# Patient Record
Sex: Female | Born: 1955 | Race: White | Hispanic: No | Marital: Married | Smoking: Never smoker
Health system: Southern US, Community
[De-identification: ages and names within clinical notes are randomized; demographics above are authoritative.]

## PROBLEM LIST (undated history)

## (undated) DIAGNOSIS — D509 Iron deficiency anemia, unspecified: Secondary | ICD-10-CM

## (undated) HISTORY — PX: TRIGGER FINGER RELEASE: SHX641

---

## 1999-05-18 ENCOUNTER — Ambulatory Visit (HOSPITAL_COMMUNITY): Admission: RE | Admit: 1999-05-18 | Discharge: 1999-05-18 | Payer: Self-pay | Admitting: Unknown Physician Specialty

## 1999-05-18 ENCOUNTER — Encounter: Payer: Self-pay | Admitting: Unknown Physician Specialty

## 2012-05-24 ENCOUNTER — Ambulatory Visit (INDEPENDENT_AMBULATORY_CARE_PROVIDER_SITE_OTHER): Payer: BC Managed Care – PPO | Admitting: Family Medicine

## 2012-05-24 ENCOUNTER — Encounter: Payer: Self-pay | Admitting: Family Medicine

## 2012-05-24 ENCOUNTER — Other Ambulatory Visit (HOSPITAL_COMMUNITY)
Admission: RE | Admit: 2012-05-24 | Discharge: 2012-05-24 | Disposition: A | Discharge status: Home / Self Care | Payer: BC Managed Care – PPO | Source: Ambulatory Visit | Attending: Family Medicine | Admitting: Family Medicine

## 2012-05-24 VITALS — BP 110/72 | HR 64 | Temp 97.9°F | Ht 61.0 in | Wt 111.0 lb

## 2012-05-24 DIAGNOSIS — Z0000 Encounter for general adult medical examination without abnormal findings: Secondary | ICD-10-CM | POA: Insufficient documentation

## 2012-05-24 DIAGNOSIS — D239 Other benign neoplasm of skin, unspecified: Secondary | ICD-10-CM

## 2012-05-24 DIAGNOSIS — D229 Melanocytic nevi, unspecified: Secondary | ICD-10-CM | POA: Insufficient documentation

## 2012-05-24 DIAGNOSIS — Z1151 Encounter for screening for human papillomavirus (HPV): Secondary | ICD-10-CM | POA: Insufficient documentation

## 2012-05-24 DIAGNOSIS — Z136 Encounter for screening for cardiovascular disorders: Secondary | ICD-10-CM

## 2012-05-24 DIAGNOSIS — Z01419 Encounter for gynecological examination (general) (routine) without abnormal findings: Secondary | ICD-10-CM | POA: Insufficient documentation

## 2012-05-24 LAB — CBC WITH DIFFERENTIAL/PLATELET
Basophils Absolute: 0 10*3/uL (ref 0.0–0.1)
Basophils Relative: 0.9 % (ref 0.0–3.0)
Eosinophils Absolute: 0.3 10*3/uL (ref 0.0–0.7)
Eosinophils Relative: 5.2 % — ABNORMAL HIGH (ref 0.0–5.0)
HCT: 35 % — ABNORMAL LOW (ref 36.0–46.0)
Hemoglobin: 11.7 g/dL — ABNORMAL LOW (ref 12.0–15.0)
Lymphocytes Relative: 34.5 % (ref 12.0–46.0)
Lymphs Abs: 1.8 10*3/uL (ref 0.7–4.0)
MCHC: 33.4 g/dL (ref 30.0–36.0)
MCV: 90.7 fl (ref 78.0–100.0)
Monocytes Absolute: 0.4 10*3/uL (ref 0.1–1.0)
Monocytes Relative: 8.5 % (ref 3.0–12.0)
Neutro Abs: 2.6 10*3/uL (ref 1.4–7.7)
Neutrophils Relative %: 50.9 % (ref 43.0–77.0)
Platelets: 264 10*3/uL (ref 150.0–400.0)
RBC: 3.86 Mil/uL — ABNORMAL LOW (ref 3.87–5.11)
RDW: 12.7 % (ref 11.5–14.6)
WBC: 5.1 10*3/uL (ref 4.5–10.5)

## 2012-05-24 LAB — COMPREHENSIVE METABOLIC PANEL
ALT: 16 U/L (ref 0–35)
AST: 15 U/L (ref 0–37)
Albumin: 4.2 g/dL (ref 3.5–5.2)
Alkaline Phosphatase: 71 U/L (ref 39–117)
BUN: 13 mg/dL (ref 6–23)
CO2: 29 mEq/L (ref 19–32)
Calcium: 9.4 mg/dL (ref 8.4–10.5)
Chloride: 105 mEq/L (ref 96–112)
Creatinine, Ser: 0.9 mg/dL (ref 0.4–1.2)
GFR: 69.67 mL/min (ref >60.00)
Glucose, Bld: 92 mg/dL (ref 70–99)
Potassium: 4.3 mEq/L (ref 3.5–5.1)
Sodium: 140 mEq/L (ref 135–145)
Total Bilirubin: 0.5 mg/dL (ref 0.3–1.2)
Total Protein: 6.9 g/dL (ref 6.0–8.3)

## 2012-05-24 LAB — LIPID PANEL
Cholesterol: 207 mg/dL — ABNORMAL HIGH (ref 0–200)
HDL: 70.1 mg/dL (ref >39.00)
Total CHOL/HDL Ratio: 3
Triglycerides: 154 mg/dL — ABNORMAL HIGH (ref 0.0–149.0)
VLDL: 30.8 mg/dL (ref 0.0–40.0)

## 2012-05-24 LAB — LDL CHOLESTEROL, DIRECT: Direct LDL: 106.8 mg/dL

## 2012-05-24 NOTE — Patient Instructions (Signed)
It was so nice to meet you. Please stop by to see Shirlee Limerick on your way out to set up your dermatology referral. We will call you with your lab results from today.

## 2012-05-24 NOTE — Progress Notes (Signed)
Subjective:    Patient ID: Denise Roy, female    DOB: 30-Mar-1956, 56 y.o.   MRN: [id number]  HPI  56 yo G2P2 here to establish care.  Post menopausal, not having any more menopausal symptoms. Last pap smear was in 2009.   No h/o abnormal pap smears. Mammogram was normal in May. Colonoscopy neg last year.  Patient Active Problem List  Diagnosis  . Routine general medical examination at a health care facility  . Multiple nevi   No past medical history on file. No past surgical history on file. History  Substance Use Topics  . Smoking status: Never Smoker   . Smokeless tobacco: Not on file  . Alcohol Use: Not on file   Family History  Problem Relation Age of Onset  . Hypertension Mother   . Cancer Father    Allergies  Allergen Reactions  . Erythorbic Acid Nausea Only   No current outpatient prescriptions on file prior to visit.   The PMH, PSH, Social History, Family History, Medications, and allergies have been reviewed in [facility], and have been updated if relevant.   Review of Systems See HPI Patient reports no  vision/ hearing changes,anorexia, weight change, fever ,adenopathy, persistant / recurrent hoarseness, swallowing issues, chest pain, edema,persistant / recurrent cough, hemoptysis, dyspnea(rest, exertional, paroxysmal nocturnal), gastrointestinal  bleeding (melena, rectal bleeding), abdominal pain, excessive heart burn, GU symptoms(dysuria, hematuria, pyuria, voiding/incontinence  Issues) syncope, focal weakness, severe memory loss, concerning skin lesions, depression, anxiety, abnormal bruising/bleeding, major joint swelling, breast masses or abnormal vaginal bleeding.       Objective:   Physical Exam BP 110/72  Pulse 64  Temp 97.9 F (36.6 C) (Oral)  Ht 5\' 1"  (1.549 m)  Wt 111 lb (50.349 kg)  BMI 20.97 kg/m2  General:  Well-developed,well-nourished,in no acute distress; alert,appropriate and cooperative throughout examination Head:  normocephalic and  atraumatic.   Eyes:  vision grossly intact, pupils equal, pupils round, and pupils reactive to light.   Ears:  R ear normal and L ear normal.   Nose:  no external deformity.   Mouth:  good dentition.   Neck:  No deformities, masses, or tenderness noted. Breasts:  No mass, nodules, thickening, tenderness, bulging, retraction, inflamation, nipple discharge or skin changes noted.   Lungs:  Normal respiratory effort, chest expands symmetrically. Lungs are clear to auscultation, no crackles or wheezes. Heart:  Normal rate and regular rhythm. S1 and S2 normal without gallop, murmur, click, rub or other extra sounds. Abdomen:  Bowel sounds positive,abdomen soft and non-tender without masses, organomegaly or hernias noted. Rectal:  no external abnormalities.   Genitalia:  Pelvic Exam:        External: normal female genitalia without lesions or masses        Vagina: normal without lesions or masses        Cervix: normal without lesions or masses        Adnexa: normal bimanual exam without masses or fullness        Uterus: normal by palpation        Pap smear: performed Msk:  No deformity or scoliosis noted of thoracic or lumbar spine.   Extremities:  No clubbing, cyanosis, edema, or deformity noted with normal full range of motion of all joints.   Neurologic:  alert & oriented X3 and gait normal.   Skin:  multiple AK, nevi Cervical Nodes:  No lymphadenopathy noted Axillary Nodes:  No palpable lymphadenopathy Psych:  Cognition and judgment appear intact. Alert and  cooperative with normal attention span and concentration. No apparent delusions, illusions, hallucinations        Assessment & Plan:   1. Routine general medical examination at a health care facility  Reviewed preventive care protocols, scheduled due services, and updated immunizations Discussed nutrition, exercise, diet, and healthy lifestyle.  CBC with Differential, Comprehensive metabolic panel, Cytology - PAP  2. Multiple nevi   Ambulatory referral to Dermatology  3. Screening for ischemic heart disease  Lipid Panel

## 2012-05-31 ENCOUNTER — Encounter: Payer: Self-pay | Admitting: Family Medicine

## 2012-05-31 ENCOUNTER — Encounter: Payer: Self-pay | Admitting: *Deleted

## 2012-05-31 LAB — HM PAP SMEAR: HM Pap smear: NORMAL

## 2012-10-03 ENCOUNTER — Encounter: Payer: Self-pay | Admitting: Family Medicine

## 2012-10-03 ENCOUNTER — Ambulatory Visit (INDEPENDENT_AMBULATORY_CARE_PROVIDER_SITE_OTHER): Payer: BC Managed Care – PPO | Admitting: Family Medicine

## 2012-10-03 VITALS — BP 106/70 | HR 66 | Temp 97.5°F | Ht 61.0 in | Wt 115.0 lb

## 2012-10-03 DIAGNOSIS — M542 Cervicalgia: Secondary | ICD-10-CM | POA: Insufficient documentation

## 2012-10-03 MED ORDER — CYCLOBENZAPRINE HCL 10 MG PO TABS: 10.0000 mg | ORAL_TABLET | Freq: Every evening | ORAL | Status: DC | PRN

## 2012-10-03 NOTE — Assessment & Plan Note (Signed)
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

## 2012-10-03 NOTE — Patient Instructions (Addendum)
I think you are suffering from tight neck muscles = and you need better positioning at night  Get a cervical support pillow made of memory foam  You can use it alone or on top of your regular pillow  Use heat on neck for 10 minutes at a time to relax muscles Try flexeril (muscle relaxer) at night  If not improved in 1-2 weeks, call for an appt with Dr Patsy Lager - our sports medicine doctor here

## 2012-10-03 NOTE — Progress Notes (Signed)
Subjective:    Patient ID: Denise Roy, female    DOB: [birth year], 56 y.o.   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 Routine general medical examination at a health care facility  . Multiple nevi   No past medical history on file. No past surgical history on file. History  Substance Use Topics  . Smoking status: Never Smoker   . Smokeless tobacco: Not on file  . Alcohol Use: No   Family History  Problem Relation Age of Onset  . Hypertension Mother   . Cancer Father    Allergies  Allergen Reactions  . Erythorbic Acid Nausea Only   Current Outpatient Prescriptions on File Prior to Visit  Medication Sig Dispense Refill  . BIOTIN PO Take 500 mg's by mouth daily      . Multiple Vitamin (MULTIVITAMIN) tablet Take 1 tablet by mouth daily.            Review of Systems Review of Systems  Constitutional: Negative for fever, appetite change, fatigue and unexpected weight change.  Eyes: Negative for pain and visual disturbance.  Respiratory: Negative for cough and shortness of breath.   Cardiovascular: Negative for cp or palpitations    Gastrointestinal: Negative for nausea, diarrhea and constipation.  Genitourinary: Negative for urgency and frequency.  Skin: Negative for pallor or rash   Neurological: Negative for weakness, light-headedness, numbness and headaches.    Hematological: Negative for adenopathy. Does not bruise/bleed easily.  Psychiatric/Behavioral: Negative for dysphoric mood. The patient is not nervous/anxious.         Objective:   Physical Exam  Constitutional: She appears well-developed and well-nourished. No distress.  HENT:  Head: Normocephalic and atraumatic.  Mouth/Throat: Oropharynx is clear and moist.  Eyes: Conjunctivae normal and EOM are normal. Pupils are equal, round, and reactive to light. Right eye exhibits no discharge. Left eye exhibits no discharge.  Neck: Normal range of motion. Neck supple. No JVD present. No tracheal deviation present. No thyromegaly present.  Cardiovascular: Normal rate and regular rhythm.   Pulmonary/Chest: Effort normal and breath sounds normal.  Musculoskeletal:       Cervical back: She exhibits tenderness, pain and spasm. She exhibits normal range of motion, no bony tenderness, no swelling, no edema and no deformity.       Nl rom of her neck - with tenderness and spasm over peri cervical musculature  Worse on the R  Nl flex/ ext More pain to rotate R  No scapular tenderness No trap tenderness   Lymphadenopathy:    She has no cervical adenopathy.  Neurological: She is alert. She has normal reflexes. She displays no atrophy. No cranial nerve deficit or sensory deficit.  She exhibits normal muscle tone. Coordination normal.  Skin: Skin is warm and dry. No rash noted. No erythema.  Psychiatric: She has a normal mood and affect.          Assessment & Plan:

## 2012-10-08 ENCOUNTER — Ambulatory Visit: Payer: BC Managed Care – PPO | Admitting: Family Medicine

## 2012-11-19 ENCOUNTER — Encounter: Payer: Self-pay | Admitting: Family Medicine

## 2012-11-19 ENCOUNTER — Ambulatory Visit (INDEPENDENT_AMBULATORY_CARE_PROVIDER_SITE_OTHER): Payer: BC Managed Care – PPO | Admitting: Family Medicine

## 2012-11-19 VITALS — BP 122/66 | HR 83 | Temp 99.6°F | Wt 112.0 lb

## 2012-11-19 DIAGNOSIS — B9789 Other viral agents as the cause of diseases classified elsewhere: Secondary | ICD-10-CM

## 2012-11-19 DIAGNOSIS — B349 Viral infection, unspecified: Secondary | ICD-10-CM | POA: Insufficient documentation

## 2012-11-19 MED ORDER — BENZONATATE 200 MG PO CAPS: 200.0000 mg | ORAL_CAPSULE | Freq: Three times a day (TID) | ORAL | Status: AC | PRN

## 2012-11-19 NOTE — Assessment & Plan Note (Signed)
Nontoxic, likely viral.  Supportive tx.  Would use tessalon for cough.  Possible flu but well appearing and >48 hours so testing would likely not change mgmt.  F/u prn.

## 2012-11-19 NOTE — Progress Notes (Signed)
Sx stared about 3 days ago.  Cough and some nausea.  Fever over the last 2-3 days.  Cough and aches.  Burning sensation in throat.  Ears feels stuffy intermittently.  No rhinorrhea.  Husband has been sick.  Fever max 101.7. Taking tylenol w/o much relief.  The cough is most bothersome to the patient.     She hasn't had a flu shot yet, discussed.    Meds, vitals, and allergies reviewed.   ROS: See HPI.  Otherwise, noncontributory.  GEN: nad, alert and oriented HEENT: mucous membranes moist, tm w/o erythema, nasal exam w/o erythema, clear discharge noted,  OP with cobblestoning, sinuses not ttp NECK: supple w/o LA CV: rrr.   PULM: ctab, no inc wob EXT: no edema SKIN: no acute rash

## 2012-11-19 NOTE — Patient Instructions (Signed)
Get a flu shot when you are well.  Use tessalon for the cough.  Drink plenty of fluids, take tylenol as needed, and gargle with warm salt water for your throat.  This should gradually improve.  Take care.  Let us know if you have other concerns.

## 2012-11-27 ENCOUNTER — Other Ambulatory Visit (INDEPENDENT_AMBULATORY_CARE_PROVIDER_SITE_OTHER): Payer: BC Managed Care – PPO

## 2012-11-27 DIAGNOSIS — R6889 Other general symptoms and signs: Secondary | ICD-10-CM

## 2012-11-27 DIAGNOSIS — R899 Unspecified abnormal finding in specimens from other organs, systems and tissues: Secondary | ICD-10-CM

## 2012-11-28 LAB — CBC WITH DIFFERENTIAL/PLATELET
Eosinophils Relative: 5.1 % — ABNORMAL HIGH (ref 0.0–5.0)
HCT: 34.6 % — ABNORMAL LOW (ref 36.0–46.0)
Hemoglobin: 11.5 g/dL — ABNORMAL LOW (ref 12.0–15.0)
Lymphs Abs: 2.2 10*3/uL (ref 0.7–4.0)
MCV: 89.3 fl (ref 78.0–100.0)
Monocytes Absolute: 0.5 10*3/uL (ref 0.1–1.0)
Neutro Abs: 3.5 10*3/uL (ref 1.4–7.7)
Platelets: 349 10*3/uL (ref 150.0–400.0)
WBC: 6.6 10*3/uL (ref 4.5–10.5)

## 2013-08-08 ENCOUNTER — Ambulatory Visit (INDEPENDENT_AMBULATORY_CARE_PROVIDER_SITE_OTHER): Payer: BC Managed Care – PPO | Admitting: Internal Medicine

## 2013-08-08 ENCOUNTER — Encounter: Payer: Self-pay | Admitting: Internal Medicine

## 2013-08-08 VITALS — BP 118/72 | HR 61 | Temp 98.9°F | Wt 114.0 lb

## 2013-08-08 DIAGNOSIS — Z130 Encounter for screening for diseases of the blood and blood-forming organs and certain disorders involving the immune mechanism: Secondary | ICD-10-CM

## 2013-08-08 DIAGNOSIS — Z0000 Encounter for general adult medical examination without abnormal findings: Secondary | ICD-10-CM

## 2013-08-08 DIAGNOSIS — Z13220 Encounter for screening for lipoid disorders: Secondary | ICD-10-CM

## 2013-08-08 LAB — COMPREHENSIVE METABOLIC PANEL
ALT: 18 U/L (ref 0–35)
AST: 19 U/L (ref 0–37)
Alkaline Phosphatase: 62 U/L (ref 39–117)
CO2: 31 mEq/L (ref 19–32)
Creatinine, Ser: 0.9 mg/dL (ref 0.4–1.2)
Sodium: 138 mEq/L (ref 135–145)
Total Bilirubin: 0.8 mg/dL (ref 0.3–1.2)
Total Protein: 6.6 g/dL (ref 6.0–8.3)

## 2013-08-08 LAB — LDL CHOLESTEROL, DIRECT: Direct LDL: 127 mg/dL

## 2013-08-08 LAB — LIPID PANEL
HDL: 63.5 mg/dL (ref >39.00)
Total CHOL/HDL Ratio: 3
VLDL: 14.8 mg/dL (ref 0.0–40.0)

## 2013-08-08 LAB — CBC
HCT: 36.6 % (ref 36.0–46.0)
MCHC: 34 g/dL (ref 30.0–36.0)
MCV: 90.1 fl (ref 78.0–100.0)
RDW: 12.7 % (ref 11.5–14.6)
WBC: 4.7 10*3/uL (ref 4.5–10.5)

## 2013-08-08 NOTE — Patient Instructions (Signed)

## 2013-08-08 NOTE — Progress Notes (Signed)
HPI  Pt presents to the clinic today for her annual exam. She has no concerns today.  Flu: never Tetanus: not up to date LMP: Post menopausal Pap Smear: 2013 Mammogram: having it done today- yearly Colonoscopy: 2012- polyps Eye Doctor: yearly Dentist: biannually  History reviewed. No pertinent past medical history.  Current Outpatient Prescriptions  Medication Sig Dispense Refill  . BIOTIN PO Take 500 mg's by mouth daily      . Multiple Vitamin (MULTIVITAMIN) tablet Take 1 tablet by mouth daily.       No current facility-administered medications for this visit.    Allergies  Allergen Reactions  . Erythromycin     Stomach upset    Family History  Problem Relation Age of Onset  . Hypertension Mother   . Cancer Father     History   Social History  . Marital Status: Married    Spouse Name: N/A    Number of Children: N/A  . Years of Education: N/A   Occupational History  . Not on file.   Social History Main Topics  . Smoking status: Never Smoker   . Smokeless tobacco: Not on file  . Alcohol Use: No  . Drug Use: No  . Sexual Activity: Not on file   Other Topics Concern  . Not on file   Social History Narrative  . No narrative on file    ROS:  Constitutional: Denies fever, malaise, fatigue, headache or abrupt weight changes.  HEENT: Denies eye pain, eye redness, ear pain, ringing in the ears, wax buildup, runny nose, nasal congestion, bloody nose, or sore throat. Respiratory: Denies difficulty breathing, shortness of breath, cough or sputum production.   Cardiovascular: Denies chest pain, chest tightness, palpitations or swelling in the hands or feet.  Gastrointestinal: Denies abdominal pain, bloating, constipation, diarrhea or blood in the stool.  GU: Denies frequency, urgency, pain with urination, blood in urine, odor or discharge. Musculoskeletal: Denies decrease in range of motion, difficulty with gait, muscle pain or joint pain and swelling.  Skin:  Denies redness, rashes, lesions or ulcercations.  Neurological: Denies dizziness, difficulty with memory, difficulty with speech or problems with balance and coordination.   No other specific complaints in a complete review of systems (except as listed in HPI above).  PE:  BP 118/72  Pulse 61  Temp(Src) 98.9 F (37.2 C) (Oral)  Wt 114 lb (51.71 kg)  BMI 21.55 kg/m2  SpO2 98% Wt Readings from Last 3 Encounters:  08/08/13 114 lb (51.71 kg)  11/19/12 112 lb (50.803 kg)  10/03/12 115 lb (52.164 kg)    General: Appears her stated age, well developed, well nourished in NAD. HEENT: Head: normal shape and size; Eyes: sclera white, no icterus, conjunctiva pink, PERRLA and EOMs intact; Ears: Tm's gray and intact, normal light reflex; Nose: mucosa pink and moist, septum midline; Throat/Mouth: Teeth present, mucosa pink and moist, no lesions or ulcerations noted.  Neck: Normal range of motion. Neck supple, trachea midline. No massses, lumps or thyromegaly present.  Cardiovascular: Normal rate and rhythm. S1,S2 noted.  No murmur, rubs or gallops noted. No JVD or BLE edema. No carotid bruits noted. Pulmonary/Chest: Normal effort and positive vesicular breath sounds. No respiratory distress. No wheezes, rales or ronchi noted.  Abdomen: Soft and nontender. Normal bowel sounds, no bruits noted. No distention or masses noted. Liver, spleen and kidneys non palpable. Musculoskeletal: Normal range of motion. No signs of joint swelling. No difficulty with gait.  Neurological: Alert and oriented. Cranial nerves  II-XII intact. Coordination normal. +DTRs bilaterally. Psychiatric: Mood and affect normal. Behavior is normal. Judgment and thought content normal.    BMET    Component Value Date/Time   NA 140 05/24/2012 1421   K 4.3 05/24/2012 1421   CL 105 05/24/2012 1421   CO2 29 05/24/2012 1421   GLUCOSE 92 05/24/2012 1421   BUN 13 05/24/2012 1421   CREATININE 0.9 05/24/2012 1421   CALCIUM 9.4 05/24/2012 1421     Lipid Panel     Component Value Date/Time   CHOL 207* 05/24/2012 1421   TRIG 154.0* 05/24/2012 1421   HDL 70.10 05/24/2012 1421   CHOLHDL 3 05/24/2012 1421   VLDL 30.8 05/24/2012 1421    CBC    Component Value Date/Time   WBC 6.6 11/27/2012 1607   RBC 3.87 11/27/2012 1607   HGB 11.5* 11/27/2012 1607   HCT 34.6* 11/27/2012 1607   PLT 349.0 11/27/2012 1607   MCV 89.3 11/27/2012 1607   MCHC 33.4 11/27/2012 1607   RDW 12.6 11/27/2012 1607   LYMPHSABS 2.2 11/27/2012 1607   MONOABS 0.5 11/27/2012 1607   EOSABS 0.3 11/27/2012 1607   BASOSABS 0.0 11/27/2012 1607    Hgb A1C No results found for this basename: HGBA1C     Assessment and Plan:  Prevent Health Maintenance:  Pt declines flu and Tdap today Will repeat pap in 2 years per ACS/ACOG guidelines Will obtain screening labs today  RTC in 1 year or sooner if needed

## 2013-08-09 ENCOUNTER — Telehealth: Payer: Self-pay

## 2013-08-09 NOTE — Telephone Encounter (Signed)
Mrs Navia returning Chrisandra Carota CMA's call. Transferred call to [employer] office [phone]. Geroge Baseman will transfer pt to [place].

## 2013-08-21 ENCOUNTER — Encounter: Payer: Self-pay | Admitting: Family Medicine

## 2013-08-22 ENCOUNTER — Telehealth: Payer: Self-pay

## 2013-08-22 NOTE — Telephone Encounter (Signed)
Will route to [facility] since she saw her for CPX and was following those labs.

## 2013-08-22 NOTE — Telephone Encounter (Signed)
Pt wants to know when she should have cholesterol labs rechecked; and should pt continue taking prenatal vitamin with iron.pt request copy of labs mailed to her home address (done).

## 2013-08-22 NOTE — Telephone Encounter (Signed)
Ok ot continue prenatal vitamin and will recheck lipid in 1 year. It was only mildly elevated

## 2013-08-23 NOTE — Telephone Encounter (Signed)
Pt notified that she can continue to take the prenatal vitamin and of recheck of lipids in 1 yr....ds,cma

## 2014-09-12 ENCOUNTER — Other Ambulatory Visit: Payer: Self-pay | Admitting: Family Medicine

## 2014-09-12 DIAGNOSIS — Z01419 Encounter for gynecological examination (general) (routine) without abnormal findings: Secondary | ICD-10-CM | POA: Insufficient documentation

## 2014-09-12 LAB — HM MAMMOGRAPHY: HM MAMMO: NEGATIVE

## 2014-09-15 ENCOUNTER — Other Ambulatory Visit (INDEPENDENT_AMBULATORY_CARE_PROVIDER_SITE_OTHER): Payer: BC Managed Care – PPO

## 2014-09-15 DIAGNOSIS — Z0000 Encounter for general adult medical examination without abnormal findings: Secondary | ICD-10-CM

## 2014-09-15 DIAGNOSIS — Z01419 Encounter for gynecological examination (general) (routine) without abnormal findings: Secondary | ICD-10-CM

## 2014-09-15 LAB — CBC WITH DIFFERENTIAL/PLATELET
BASOS PCT: 0.7 % (ref 0.0–3.0)
Basophils Absolute: 0 10*3/uL (ref 0.0–0.1)
EOS PCT: 7.6 % — AB (ref 0.0–5.0)
Eosinophils Absolute: 0.4 10*3/uL (ref 0.0–0.7)
HEMATOCRIT: 36.6 % (ref 36.0–46.0)
Hemoglobin: 12 g/dL (ref 12.0–15.0)
Lymphocytes Relative: 35.8 % (ref 12.0–46.0)
Lymphs Abs: 1.9 10*3/uL (ref 0.7–4.0)
MCHC: 32.9 g/dL (ref 30.0–36.0)
MCV: 91.2 fl (ref 78.0–100.0)
MONO ABS: 0.4 10*3/uL (ref 0.1–1.0)
Monocytes Relative: 8 % (ref 3.0–12.0)
Neutro Abs: 2.5 10*3/uL (ref 1.4–7.7)
Neutrophils Relative %: 47.9 % (ref 43.0–77.0)
PLATELETS: 236 10*3/uL (ref 150.0–400.0)
RBC: 4.01 Mil/uL (ref 3.87–5.11)
RDW: 12.7 % (ref 11.5–15.5)
WBC: 5.3 10*3/uL (ref 4.0–10.5)

## 2014-09-15 LAB — COMPREHENSIVE METABOLIC PANEL
ALBUMIN: 3.7 g/dL (ref 3.5–5.2)
ALK PHOS: 73 U/L (ref 39–117)
ALT: 19 U/L (ref 0–35)
AST: 20 U/L (ref 0–37)
BILIRUBIN TOTAL: 0.7 mg/dL (ref 0.2–1.2)
BUN: 14 mg/dL (ref 6–23)
CO2: 24 mEq/L (ref 19–32)
Calcium: 9.6 mg/dL (ref 8.4–10.5)
Chloride: 105 mEq/L (ref 96–112)
Creatinine, Ser: 0.9 mg/dL (ref 0.4–1.2)
GFR: 64.88 mL/min (ref >60.00)
GLUCOSE: 95 mg/dL (ref 70–99)
Potassium: 4.5 mEq/L (ref 3.5–5.1)
Sodium: 142 mEq/L (ref 135–145)
Total Protein: 6.9 g/dL (ref 6.0–8.3)

## 2014-09-15 LAB — LIPID PANEL
CHOLESTEROL: 206 mg/dL — AB (ref 0–200)
HDL: 56 mg/dL (ref >39.00)
LDL CALC: 132 mg/dL — AB (ref 0–99)
NonHDL: 150
Total CHOL/HDL Ratio: 4
Triglycerides: 90 mg/dL (ref 0.0–149.0)
VLDL: 18 mg/dL (ref 0.0–40.0)

## 2014-09-15 LAB — TSH: TSH: 2.23 u[IU]/mL (ref 0.35–4.50)

## 2014-09-17 ENCOUNTER — Encounter: Payer: Self-pay | Admitting: *Deleted

## 2014-09-18 ENCOUNTER — Encounter: Payer: Self-pay | Admitting: Family Medicine

## 2014-09-18 ENCOUNTER — Other Ambulatory Visit (HOSPITAL_COMMUNITY)
Admission: RE | Admit: 2014-09-18 | Discharge: 2014-09-18 | Disposition: A | Discharge status: Home / Self Care | Payer: BC Managed Care – PPO | Source: Ambulatory Visit | Attending: Family Medicine | Admitting: Family Medicine

## 2014-09-18 ENCOUNTER — Ambulatory Visit (INDEPENDENT_AMBULATORY_CARE_PROVIDER_SITE_OTHER): Payer: BC Managed Care – PPO | Admitting: Family Medicine

## 2014-09-18 VITALS — BP 110/60 | HR 55 | Temp 98.3°F | Ht 60.75 in | Wt 113.5 lb

## 2014-09-18 DIAGNOSIS — Z01419 Encounter for gynecological examination (general) (routine) without abnormal findings: Secondary | ICD-10-CM | POA: Diagnosis present

## 2014-09-18 DIAGNOSIS — Z1151 Encounter for screening for human papillomavirus (HPV): Secondary | ICD-10-CM | POA: Insufficient documentation

## 2014-09-18 DIAGNOSIS — Z0000 Encounter for general adult medical examination without abnormal findings: Secondary | ICD-10-CM

## 2014-09-18 NOTE — Progress Notes (Signed)
Pre visit review using our clinic review tool, if applicable. No additional management support is needed unless otherwise documented below in the visit note. 

## 2014-09-18 NOTE — Progress Notes (Signed)
Subjective:    Patient ID: Denise Roy, female    DOB: 12/10/1955, 58 y.o.   MRN: [id number]  HPI  58 yo G2P2 here to establish care.  Post menopausal, not having any more menopausal symptoms. Last pap smear was 05/24/12 (done by me).   No h/o abnormal pap smears. No post menopausal bleeding. Mammogram was normal 09/12/14.  Colonoscopy neg 06/02/11- Dr. Vira Agar.  Lab Results  Component Value Date   CHOL 206* 09/15/2014   HDL 56.00 09/15/2014   LDLCALC 132* 09/15/2014   LDLDIRECT 127.0 08/08/2013   TRIG 90.0 09/15/2014   CHOLHDL 4 09/15/2014   Lab Results  Component Value Date   WBC 5.3 09/15/2014   HGB 12.0 09/15/2014   HCT 36.6 09/15/2014   MCV 91.2 09/15/2014   PLT 236.0 09/15/2014   Lab Results  Component Value Date   CREATININE 0.9 09/15/2014   Lab Results  Component Value Date   TSH 2.23 09/15/2014   Lab Results  Component Value Date   NA 142 09/15/2014   K 4.5 09/15/2014   CL 105 09/15/2014   CO2 24 09/15/2014     Patient Active Problem List   Diagnosis Date Noted  . Well woman exam 09/12/2014   No past medical history on file. No past surgical history on file. History  Substance Use Topics  . Smoking status: Never Smoker   . Smokeless tobacco: Not on file  . Alcohol Use: No   Family History  Problem Relation Age of Onset  . Hypertension Mother   . Cancer Father    Allergies  Allergen Reactions  . Erythromycin     Stomach upset   Current Outpatient Prescriptions on File Prior to Visit  Medication Sig Dispense Refill  . BIOTIN PO Take 500 mg's by mouth daily    . Multiple Vitamin (MULTIVITAMIN) tablet Take 1 tablet by mouth daily.     No current facility-administered medications on file prior to visit.   The PMH, PSH, Social History, Family History, Medications, and allergies have been reviewed in [facility], and have been updated if relevant.   Review of Systems See HPI Patient reports no  vision/ hearing changes,anorexia, weight  change, fever ,adenopathy, persistant / recurrent hoarseness, swallowing issues, chest pain, edema,persistant / recurrent cough, hemoptysis, dyspnea(rest, exertional, paroxysmal nocturnal), gastrointestinal  bleeding (melena, rectal bleeding), abdominal pain, excessive heart burn, GU symptoms(dysuria, hematuria, pyuria, voiding/incontinence  Issues) syncope, focal weakness, severe memory loss, concerning skin lesions, depression, anxiety, abnormal bruising/bleeding, major joint swelling, breast masses or abnormal vaginal bleeding.       Objective:   Physical Exam BP 110/60 mmHg  Pulse 55  Temp(Src) 98.3 F (36.8 C) (Oral)  Ht 5' 0.75" (1.543 m)  Wt 113 lb 8 oz (51.483 kg)  BMI 21.62 kg/m2  SpO2 97%  General:  Well-developed,well-nourished,in no acute distress; alert,appropriate and cooperative throughout examination Head:  normocephalic and atraumatic.   Eyes:  vision grossly intact, pupils equal, pupils round, and pupils reactive to light.   Ears:  R ear normal and L ear normal.   Nose:  no external deformity.   Mouth:  good dentition.   Neck:  No deformities, masses, or tenderness noted. Breasts:  No mass, nodules, thickening, tenderness, bulging, retraction, inflamation, nipple discharge or skin changes noted.   Lungs:  Normal respiratory effort, chest expands symmetrically. Lungs are clear to auscultation, no crackles or wheezes. Heart:  Normal rate and regular rhythm. S1 and S2 normal without gallop, murmur, click, rub or  other extra sounds. Abdomen:  Bowel sounds positive,abdomen soft and non-tender without masses, organomegaly or hernias noted. Rectal:  no external abnormalities.   Genitalia:  Pelvic Exam:        External: normal female genitalia without lesions or masses        Vagina: normal without lesions or masses        Cervix: normal without lesions or masses        Adnexa: normal bimanual exam without masses or fullness        Uterus: normal by palpation        Pap  smear: performed Msk:  No deformity or scoliosis noted of thoracic or lumbar spine.   Extremities:  No clubbing, cyanosis, edema, or deformity noted with normal full range of motion of all joints.   Neurologic:  alert & oriented X3 and gait normal.   Skin:  multiple AK, nevi Cervical Nodes:  No lymphadenopathy noted Axillary Nodes:  No palpable lymphadenopathy Psych:  Cognition and judgment appear intact. Alert and cooperative with normal attention span and concentration. No apparent delusions, illusions, hallucinations        Assessment & Plan:

## 2014-09-18 NOTE — Patient Instructions (Addendum)
Great to see you. Your blood work looked great. Happy Holidays. Check with your insurance to see if they will cover the shingles shot.

## 2014-09-18 NOTE — Assessment & Plan Note (Signed)
Reviewed preventive care protocols, scheduled due services, and updated immunizations Discussed nutrition, exercise, diet, and healthy lifestyle.  Declines influenza vaccine.  Discussed USPSTF recommendations of cervical cancer screening.  She is aware that interval of 3 years is recommended but pt would prefer to have pap smear done today.

## 2014-09-18 NOTE — Addendum Note (Signed)
Addended by: Modena Nunnery on: 09/18/2014 12:41 PM   Modules accepted: Orders

## 2014-09-19 LAB — CYTOLOGY - PAP

## 2014-09-22 ENCOUNTER — Encounter: Payer: Self-pay | Admitting: *Deleted

## 2014-10-15 ENCOUNTER — Other Ambulatory Visit: Payer: Self-pay

## 2015-09-21 ENCOUNTER — Other Ambulatory Visit (INDEPENDENT_AMBULATORY_CARE_PROVIDER_SITE_OTHER): Payer: BLUE CROSS/BLUE SHIELD

## 2015-09-21 ENCOUNTER — Other Ambulatory Visit: Payer: Self-pay | Admitting: Family Medicine

## 2015-09-21 DIAGNOSIS — Z0000 Encounter for general adult medical examination without abnormal findings: Secondary | ICD-10-CM | POA: Diagnosis not present

## 2015-09-21 DIAGNOSIS — Z01419 Encounter for gynecological examination (general) (routine) without abnormal findings: Secondary | ICD-10-CM

## 2015-09-21 LAB — CBC WITH DIFFERENTIAL/PLATELET
Basophils Absolute: 0 10*3/uL (ref 0.0–0.1)
Basophils Relative: 0.9 % (ref 0.0–3.0)
EOS PCT: 4.2 % (ref 0.0–5.0)
Eosinophils Absolute: 0.2 10*3/uL (ref 0.0–0.7)
HEMATOCRIT: 36.3 % (ref 36.0–46.0)
Hemoglobin: 12 g/dL (ref 12.0–15.0)
LYMPHS ABS: 1.9 10*3/uL (ref 0.7–4.0)
Lymphocytes Relative: 40.7 % (ref 12.0–46.0)
MCHC: 33 g/dL (ref 30.0–36.0)
MCV: 90.4 fl (ref 78.0–100.0)
MONOS PCT: 9.2 % (ref 3.0–12.0)
Monocytes Absolute: 0.4 10*3/uL (ref 0.1–1.0)
NEUTROS ABS: 2.1 10*3/uL (ref 1.4–7.7)
NEUTROS PCT: 45 % (ref 43.0–77.0)
PLATELETS: 252 10*3/uL (ref 150.0–400.0)
RBC: 4.01 Mil/uL (ref 3.87–5.11)
RDW: 12.9 % (ref 11.5–15.5)
WBC: 4.6 10*3/uL (ref 4.0–10.5)

## 2015-09-21 LAB — LIPID PANEL
Cholesterol: 211 mg/dL — ABNORMAL HIGH (ref 0–200)
HDL: 62.7 mg/dL (ref >39.00)
LDL Cholesterol: 128 mg/dL — ABNORMAL HIGH (ref 0–99)
NONHDL: 148.16
TRIGLYCERIDES: 101 mg/dL (ref 0.0–149.0)
Total CHOL/HDL Ratio: 3
VLDL: 20.2 mg/dL (ref 0.0–40.0)

## 2015-09-21 LAB — COMPREHENSIVE METABOLIC PANEL
ALK PHOS: 82 U/L (ref 39–117)
ALT: 17 U/L (ref 0–35)
AST: 18 U/L (ref 0–37)
Albumin: 4.3 g/dL (ref 3.5–5.2)
BUN: 13 mg/dL (ref 6–23)
CALCIUM: 9.8 mg/dL (ref 8.4–10.5)
CO2: 31 meq/L (ref 19–32)
Chloride: 106 mEq/L (ref 96–112)
Creatinine, Ser: 0.89 mg/dL (ref 0.40–1.20)
GFR: 68.86 mL/min (ref >60.00)
GLUCOSE: 94 mg/dL (ref 70–99)
POTASSIUM: 4.6 meq/L (ref 3.5–5.1)
Sodium: 142 mEq/L (ref 135–145)
TOTAL PROTEIN: 6.8 g/dL (ref 6.0–8.3)
Total Bilirubin: 0.4 mg/dL (ref 0.2–1.2)

## 2015-09-21 LAB — TSH: TSH: 1.63 u[IU]/mL (ref 0.35–4.50)

## 2015-09-22 LAB — HIV ANTIBODY (ROUTINE TESTING W REFLEX): HIV 1&2 Ab, 4th Generation: NONREACTIVE

## 2015-09-22 LAB — HEPATITIS C ANTIBODY: HCV AB: NEGATIVE

## 2015-09-28 ENCOUNTER — Other Ambulatory Visit (HOSPITAL_COMMUNITY)
Admission: RE | Admit: 2015-09-28 | Discharge: 2015-09-28 | Disposition: A | Discharge status: Home / Self Care | Payer: BLUE CROSS/BLUE SHIELD | Source: Ambulatory Visit | Attending: Family Medicine | Admitting: Family Medicine

## 2015-09-28 ENCOUNTER — Encounter: Payer: Self-pay | Admitting: Family Medicine

## 2015-09-28 ENCOUNTER — Ambulatory Visit (INDEPENDENT_AMBULATORY_CARE_PROVIDER_SITE_OTHER): Payer: BLUE CROSS/BLUE SHIELD | Admitting: Family Medicine

## 2015-09-28 VITALS — BP 110/68 | HR 55 | Temp 98.2°F | Ht 60.5 in | Wt 114.5 lb

## 2015-09-28 DIAGNOSIS — Z0000 Encounter for general adult medical examination without abnormal findings: Secondary | ICD-10-CM

## 2015-09-28 DIAGNOSIS — Z1151 Encounter for screening for human papillomavirus (HPV): Secondary | ICD-10-CM | POA: Diagnosis not present

## 2015-09-28 DIAGNOSIS — Z01419 Encounter for gynecological examination (general) (routine) without abnormal findings: Secondary | ICD-10-CM | POA: Diagnosis present

## 2015-09-28 NOTE — Assessment & Plan Note (Signed)
Reviewed preventive care protocols, scheduled due services, and updated immunizations Discussed nutrition, exercise, diet, and healthy lifestyle.  Discussed USPSTF recommendations of cervical cancer screening.  She is aware that interval of 3 years is recommended but pt would prefer to have pap smear done today.  

## 2015-09-28 NOTE — Patient Instructions (Signed)
Great to see you. Happy Holidays.   

## 2015-09-28 NOTE — Addendum Note (Signed)
Addended by: Modena Nunnery on: 09/28/2015 03:38 PM   Modules accepted: Orders

## 2015-09-28 NOTE — Progress Notes (Signed)
Subjective:    Patient ID: Denise Roy, female    DOB: Jul 19, 1956, 59 y.o.   MRN: [id number]  HPI  59 yo G2P2 here for CPX and follow up chronic medical conditions.  Post menopausal, not having any more menopausal symptoms. Last pap smear was 05/24/12 (done by me).   No h/o abnormal pap smears. No post menopausal bleeding. Mammogram was normal 2 weeks ago- we have not yet received the report.  Colonoscopy neg 09/19/11- Dr. Vira Agar.  Lab Results  Component Value Date   CHOL 211* 09/21/2015   HDL 62.70 09/21/2015   LDLCALC 128* 09/21/2015   LDLDIRECT 127.0 08/08/2013   TRIG 101.0 09/21/2015   CHOLHDL 3 09/21/2015   Lab Results  Component Value Date   WBC 4.6 09/21/2015   HGB 12.0 09/21/2015   HCT 36.3 09/21/2015   MCV 90.4 09/21/2015   PLT 252.0 09/21/2015   Lab Results  Component Value Date   CREATININE 0.89 09/21/2015   Lab Results  Component Value Date   TSH 1.63 09/21/2015   Lab Results  Component Value Date   NA 142 09/21/2015   K 4.6 09/21/2015   CL 106 09/21/2015   CO2 31 09/21/2015     Patient Active Problem List   Diagnosis Date Noted  . Well woman exam 09/12/2014   No past medical history on file. No past surgical history on file. Social History  Substance Use Topics  . Smoking status: Never Smoker   . Smokeless tobacco: None  . Alcohol Use: No   Family History  Problem Relation Age of Onset  . Hypertension Mother   . Cancer Father    Allergies  Allergen Reactions  . Erythromycin     Stomach upset   Current Outpatient Prescriptions on File Prior to Visit  Medication Sig Dispense Refill  . BIOTIN PO Take 500 mg's by mouth daily    . Multiple Vitamin (MULTIVITAMIN) tablet Take 1 tablet by mouth daily.     No current facility-administered medications on file prior to visit.   The PMH, PSH, Social History, Family History, Medications, and allergies have been reviewed in [facility], and have been updated if relevant.   Review of Systems   Constitutional: Negative.   HENT: Negative.   Respiratory: Negative.   Cardiovascular: Negative.   Gastrointestinal: Negative.   Endocrine: Negative.   Genitourinary: Negative.   Musculoskeletal: Negative.   Skin: Negative.   Allergic/Immunologic: Negative.   Neurological: Negative.   Hematological: Negative.   Psychiatric/Behavioral: Negative.   All other systems reviewed and are negative.       Objective:   Physical Exam BP 110/68 mmHg  Pulse 55  Temp(Src) 98.2 F (36.8 C) (Oral)  Ht 5' 0.5" (1.537 m)  Wt 114 lb 8 oz (51.937 kg)  BMI 21.99 kg/m2  SpO2 98%  General:  Well-developed,well-nourished,in no acute distress; alert,appropriate and cooperative throughout examination Head:  normocephalic and atraumatic.   Eyes:  vision grossly intact, pupils equal, pupils round, and pupils reactive to light.   Ears:  R ear normal and L ear normal.   Nose:  no external deformity.   Mouth:  good dentition.   Neck:  No deformities, masses, or tenderness noted. Breasts:  No mass, nodules, thickening, tenderness, bulging, retraction, inflamation, nipple discharge or skin changes noted.   Lungs:  Normal respiratory effort, chest expands symmetrically. Lungs are clear to auscultation, no crackles or wheezes. Heart:  Normal rate and regular rhythm. S1 and S2 normal without gallop, murmur,  click, rub or other extra sounds. Abdomen:  Bowel sounds positive,abdomen soft and non-tender without masses, organomegaly or hernias noted. Rectal:  no external abnormalities.   Genitalia:  Pelvic Exam:        External: normal female genitalia without lesions or masses        Vagina: normal without lesions or masses        Cervix: normal without lesions or masses        Adnexa: normal bimanual exam without masses or fullness        Uterus: normal by palpation        Pap smear: performed Msk:  No deformity or scoliosis noted of thoracic or lumbar spine.   Extremities:  No clubbing, cyanosis, edema,  or deformity noted with normal full range of motion of all joints.   Neurologic:  alert & oriented X3 and gait normal.   Skin:  multiple AK, nevi Cervical Nodes:  No lymphadenopathy noted Axillary Nodes:  No palpable lymphadenopathy Psych:  Cognition and judgment appear intact. Alert and cooperative with normal attention span and concentration. No apparent delusions, illusions, hallucinations        Assessment & Plan:

## 2015-09-28 NOTE — Progress Notes (Signed)
Pre visit review using our clinic review tool, if applicable. No additional management support is needed unless otherwise documented below in the visit note. 

## 2015-09-29 LAB — CYTOLOGY - PAP

## 2015-10-05 ENCOUNTER — Encounter: Payer: Self-pay | Admitting: *Deleted

## 2015-10-22 ENCOUNTER — Encounter: Payer: Self-pay | Admitting: Family Medicine

## 2016-03-14 DIAGNOSIS — Z85828 Personal history of other malignant neoplasm of skin: Secondary | ICD-10-CM | POA: Diagnosis not present

## 2016-03-14 DIAGNOSIS — L812 Freckles: Secondary | ICD-10-CM | POA: Diagnosis not present

## 2016-03-14 DIAGNOSIS — D1801 Hemangioma of skin and subcutaneous tissue: Secondary | ICD-10-CM | POA: Diagnosis not present

## 2016-03-14 DIAGNOSIS — D235 Other benign neoplasm of skin of trunk: Secondary | ICD-10-CM | POA: Diagnosis not present

## 2016-09-18 ENCOUNTER — Other Ambulatory Visit: Payer: Self-pay | Admitting: Family Medicine

## 2016-09-18 DIAGNOSIS — Z01419 Encounter for gynecological examination (general) (routine) without abnormal findings: Secondary | ICD-10-CM

## 2016-09-21 ENCOUNTER — Other Ambulatory Visit (INDEPENDENT_AMBULATORY_CARE_PROVIDER_SITE_OTHER): Payer: BLUE CROSS/BLUE SHIELD

## 2016-09-21 DIAGNOSIS — Z01419 Encounter for gynecological examination (general) (routine) without abnormal findings: Secondary | ICD-10-CM | POA: Diagnosis not present

## 2016-09-21 LAB — COMPREHENSIVE METABOLIC PANEL
ALBUMIN: 4.4 g/dL (ref 3.5–5.2)
ALK PHOS: 80 U/L (ref 39–117)
ALT: 16 U/L (ref 0–35)
AST: 18 U/L (ref 0–37)
BUN: 15 mg/dL (ref 6–23)
CO2: 28 mEq/L (ref 19–32)
CREATININE: 0.92 mg/dL (ref 0.40–1.20)
Calcium: 9.9 mg/dL (ref 8.4–10.5)
Chloride: 105 mEq/L (ref 96–112)
GFR: 66.05 mL/min (ref >60.00)
Glucose, Bld: 88 mg/dL (ref 70–99)
Potassium: 4.5 mEq/L (ref 3.5–5.1)
SODIUM: 141 meq/L (ref 135–145)
TOTAL PROTEIN: 7 g/dL (ref 6.0–8.3)
Total Bilirubin: 0.6 mg/dL (ref 0.2–1.2)

## 2016-09-21 LAB — CBC WITH DIFFERENTIAL/PLATELET
BASOS ABS: 0 10*3/uL (ref 0.0–0.1)
Basophils Relative: 0.9 % (ref 0.0–3.0)
EOS ABS: 0.3 10*3/uL (ref 0.0–0.7)
EOS PCT: 5.4 % — AB (ref 0.0–5.0)
HCT: 35.7 % — ABNORMAL LOW (ref 36.0–46.0)
HEMOGLOBIN: 12 g/dL (ref 12.0–15.0)
Lymphocytes Relative: 35 % (ref 12.0–46.0)
Lymphs Abs: 1.8 10*3/uL (ref 0.7–4.0)
MCHC: 33.6 g/dL (ref 30.0–36.0)
MCV: 89.6 fl (ref 78.0–100.0)
MONO ABS: 0.4 10*3/uL (ref 0.1–1.0)
Monocytes Relative: 8 % (ref 3.0–12.0)
Neutro Abs: 2.7 10*3/uL (ref 1.4–7.7)
Neutrophils Relative %: 50.7 % (ref 43.0–77.0)
Platelets: 255 10*3/uL (ref 150.0–400.0)
RBC: 3.98 Mil/uL (ref 3.87–5.11)
RDW: 12.8 % (ref 11.5–15.5)
WBC: 5.3 10*3/uL (ref 4.0–10.5)

## 2016-09-21 LAB — LIPID PANEL
CHOLESTEROL: 207 mg/dL — AB (ref 0–200)
HDL: 66.9 mg/dL (ref >39.00)
LDL Cholesterol: 123 mg/dL — ABNORMAL HIGH (ref 0–99)
NONHDL: 139.65
Total CHOL/HDL Ratio: 3
Triglycerides: 85 mg/dL (ref 0.0–149.0)
VLDL: 17 mg/dL (ref 0.0–40.0)

## 2016-09-21 LAB — TSH: TSH: 2.24 u[IU]/mL (ref 0.35–4.50)

## 2016-09-23 DIAGNOSIS — Z1231 Encounter for screening mammogram for malignant neoplasm of breast: Secondary | ICD-10-CM | POA: Diagnosis not present

## 2016-09-26 ENCOUNTER — Encounter: Payer: Self-pay | Admitting: Family Medicine

## 2016-09-28 ENCOUNTER — Other Ambulatory Visit (HOSPITAL_COMMUNITY)
Admission: RE | Admit: 2016-09-28 | Discharge: 2016-09-28 | Disposition: A | Discharge status: Home / Self Care | Payer: BLUE CROSS/BLUE SHIELD | Source: Ambulatory Visit | Attending: Family Medicine | Admitting: Family Medicine

## 2016-09-28 ENCOUNTER — Encounter: Payer: BLUE CROSS/BLUE SHIELD | Admitting: Family Medicine

## 2016-09-28 ENCOUNTER — Ambulatory Visit (INDEPENDENT_AMBULATORY_CARE_PROVIDER_SITE_OTHER): Payer: BLUE CROSS/BLUE SHIELD | Admitting: Family Medicine

## 2016-09-28 ENCOUNTER — Encounter: Payer: Self-pay | Admitting: Family Medicine

## 2016-09-28 VITALS — BP 124/62 | HR 56 | Temp 98.3°F | Ht 60.75 in | Wt 113.5 lb

## 2016-09-28 DIAGNOSIS — Z01419 Encounter for gynecological examination (general) (routine) without abnormal findings: Secondary | ICD-10-CM | POA: Insufficient documentation

## 2016-09-28 DIAGNOSIS — Z1151 Encounter for screening for human papillomavirus (HPV): Secondary | ICD-10-CM | POA: Insufficient documentation

## 2016-09-28 NOTE — Progress Notes (Signed)
Subjective:    Patient ID: Denise Roy, female    DOB: Aug 01, 1956, 60 y.o.   MRN: [id number]  HPI  Pleasant 60 yo G2P2 here for CPX and follow up chronic medical conditions.  Post menopausal,  No h/o post menopausal bleeding. Last pap smear was 09/28/15 (done by me).   No h/o abnormal pap smears.  Mammogram 09/23/16  Colonoscopy neg 09/19/11- Dr. Vira Agar.  Lab Results  Component Value Date   CHOL 207 (H) 09/21/2016   HDL 66.90 09/21/2016   LDLCALC 123 (H) 09/21/2016   LDLDIRECT 127.0 08/08/2013   TRIG 85.0 09/21/2016   CHOLHDL 3 09/21/2016   Lab Results  Component Value Date   WBC 5.3 09/21/2016   HGB 12.0 09/21/2016   HCT 35.7 (L) 09/21/2016   MCV 89.6 09/21/2016   PLT 255.0 09/21/2016   Lab Results  Component Value Date   CREATININE 0.92 09/21/2016   Lab Results  Component Value Date   TSH 2.24 09/21/2016   Lab Results  Component Value Date   NA 141 09/21/2016   K 4.5 09/21/2016   CL 105 09/21/2016   CO2 28 09/21/2016     Patient Active Problem List  Diagnosis  . Well woman exam   No past medical history on file. No past surgical history on file. Social History  Substance Use Topics  . Smoking status: Never Smoker  . Smokeless tobacco: Not on file  . Alcohol use No   Family History  Problem Relation Age of Onset  . Hypertension Mother   . Cancer Father    Allergies  Allergen Reactions  . Erythromycin     Stomach upset   Current Outpatient Prescriptions on File Prior to Visit  Medication Sig Dispense Refill  . BIOTIN PO Take 500 mg's by mouth daily    . Multiple Vitamin (MULTIVITAMIN) tablet Take 1 tablet by mouth daily.     No current facility-administered medications on file prior to visit.    The PMH, PSH, Social History, Family History, Medications, and allergies have been reviewed in [facility], and have been updated if relevant.   Review of Systems  Constitutional: Negative.   HENT: Negative.   Respiratory: Negative.    Cardiovascular: Negative.   Gastrointestinal: Negative.   Endocrine: Negative.   Genitourinary: Negative.   Musculoskeletal: Negative.   Skin: Negative.   Allergic/Immunologic: Negative.   Neurological: Negative.   Hematological: Negative.   Psychiatric/Behavioral: Negative.   All other systems reviewed and are negative.       Objective:   Physical Exam Ht 5' 0.75" (1.543 m)   Wt 113 lb 8 oz (51.5 kg)   BMI 21.62 kg/m     General:  Well-developed,well-nourished,in no acute distress; alert,appropriate and cooperative throughout examination Head:  normocephalic and atraumatic.   Eyes:  vision grossly intact, PERRL Ears:  R ear normal and L ear normal externally, TMs clear bilaterally Nose:  no external deformity.   Mouth:  good dentition.   Neck:  No deformities, masses, or tenderness noted. Breasts:  No mass, nodules, thickening, tenderness, bulging, retraction, inflamation, nipple discharge or skin changes noted.   Lungs:  Normal respiratory effort, chest expands symmetrically. Lungs are clear to auscultation, no crackles or wheezes. Heart:  Normal rate and regular rhythm. S1 and S2 normal without gallop, murmur, click, rub or other extra sounds. Abdomen:  Bowel sounds positive,abdomen soft and non-tender without masses, organomegaly or hernias noted. Rectal:  no external abnormalities.   Genitalia:  Pelvic Exam:  External: normal female genitalia without lesions or masses        Vagina: normal without lesions or masses        Cervix: normal without lesions or masses        Adnexa: normal bimanual exam without masses or fullness        Uterus: normal by palpation        Pap smear: performed Msk:  No deformity or scoliosis noted of thoracic or lumbar spine.   Extremities:  No clubbing, cyanosis, edema, or deformity noted with normal full range of motion of all joints.   Neurologic:  alert & oriented X3 and gait normal.   Skin:  Intact without suspicious lesions or  rashes Cervical Nodes:  No lymphadenopathy noted Axillary Nodes:  No palpable lymphadenopathy Psych:  Cognition and judgment appear intact. Alert and cooperative with normal attention span and concentration. No apparent delusions, illusions, hallucinations       Assessment & Plan:

## 2016-09-28 NOTE — Addendum Note (Signed)
Addended by: Modena Nunnery on: 09/28/2016 11:22 AM   Modules accepted: Orders

## 2016-09-28 NOTE — Patient Instructions (Signed)
Great to see you. Happy Holidays.   

## 2016-09-28 NOTE — Progress Notes (Signed)
Pre visit review using our clinic review tool, if applicable. No additional management support is needed unless otherwise documented below in the visit note. 

## 2016-09-28 NOTE — Assessment & Plan Note (Signed)
Reviewed preventive care protocols, scheduled due services, and updated immunizations Discussed nutrition, exercise, diet, and healthy lifestyle.  Discussed USPSTF recommendations of cervical cancer screening.  She is aware that interval of 3 years is recommended but pt would prefer to have pap smear done today.  

## 2016-10-03 ENCOUNTER — Encounter: Payer: Self-pay | Admitting: *Deleted

## 2016-10-03 LAB — CYTOLOGY - PAP
Diagnosis: NEGATIVE
HPV (WINDOPATH): NOT DETECTED

## 2016-10-04 ENCOUNTER — Ambulatory Visit (INDEPENDENT_AMBULATORY_CARE_PROVIDER_SITE_OTHER): Payer: BLUE CROSS/BLUE SHIELD | Admitting: *Deleted

## 2016-10-04 DIAGNOSIS — Z2911 Encounter for prophylactic immunotherapy for respiratory syncytial virus (RSV): Secondary | ICD-10-CM | POA: Diagnosis not present

## 2016-10-04 DIAGNOSIS — Z23 Encounter for immunization: Secondary | ICD-10-CM

## 2016-12-01 DIAGNOSIS — L300 Nummular dermatitis: Secondary | ICD-10-CM | POA: Diagnosis not present

## 2016-12-01 DIAGNOSIS — L570 Actinic keratosis: Secondary | ICD-10-CM | POA: Diagnosis not present

## 2016-12-01 DIAGNOSIS — L439 Lichen planus, unspecified: Secondary | ICD-10-CM | POA: Diagnosis not present

## 2017-03-13 DIAGNOSIS — L820 Inflamed seborrheic keratosis: Secondary | ICD-10-CM | POA: Diagnosis not present

## 2017-03-13 DIAGNOSIS — L821 Other seborrheic keratosis: Secondary | ICD-10-CM | POA: Diagnosis not present

## 2017-03-13 DIAGNOSIS — D225 Melanocytic nevi of trunk: Secondary | ICD-10-CM | POA: Diagnosis not present

## 2017-03-13 DIAGNOSIS — L814 Other melanin hyperpigmentation: Secondary | ICD-10-CM | POA: Diagnosis not present

## 2017-06-09 ENCOUNTER — Telehealth: Payer: Self-pay | Admitting: Family Medicine

## 2017-06-09 NOTE — Telephone Encounter (Signed)
[facility] Patient Name: JOLICIA DELIRA DOB: 17-Jan-1956 Initial Comment Caller states, has a cough off an on. Nurse Assessment Nurse: Dimas Chyle, RN, Dellis Filbert Date/Time Eilene Ghazi Time): 06/09/2017 9:50:34 AM Confirm and document reason for call. If symptomatic, describe symptoms. ---Caller states, has a cough off an on for 2 weeks. Coughing up some phlegm. No fever. Does the patient have any new or worsening symptoms? ---Yes Will a triage be completed? ---Yes Related visit to physician within the last 2 weeks? ---No Does the PT have any chronic conditions? (i.e. diabetes, asthma, etc.) ---No Is this a behavioral health or substance abuse call? ---No Guidelines Guideline Title Affirmed Question Affirmed Notes Cough - Acute Non-Productive Cough Final Disposition User Ramsey, RN, Dellis Filbert Disagree/Comply: Comply

## 2017-06-09 NOTE — Telephone Encounter (Signed)
PLEASE NOTE: All timestamps contained within this report are represented as [place] Standard Time. CONFIDENTIALTY NOTICE: This fax transmission is intended only for the addressee. It contains information that is legally privileged, confidential or otherwise protected from use or disclosure. If you are not the intended recipient, you are strictly prohibited from reviewing, disclosing, copying using or disseminating any of this information or taking any action in reliance on or regarding this information. If you have received this fax in error, please notify us immediately by telephone so that we can arrange for its return to [place]. Phone: [phone], Toll-Free: [phone], Fax: [phone] Page: 1 of 2 Call Id: [id number] [place] Patient Name: Denise Roy Gender: Female DOB: [birth year] Age: 61 Y 61 M 19 D Return Phone Number: [phone] (Primary), [phone] (Secondary) City/State/Zip: [place] [address] Client [facility] - Client Client Site [place] - Day Physician Arnette Norris - MD Who Is Calling Patient / Member / Family / Caregiver Call Type Triage / Clinical Relationship To Patient Self Return Phone Number [phone] (Primary) Chief Complaint Cough Reason for Call Symptomatic / Request for Health Information Initial Comment Caller states, has a cough off an on. Appointment Disposition EMR Appointment Not Necessary Info pasted into Epic Yes Nurse Assessment Nurse: Dimas Chyle, RN, Dellis Filbert Date/Time Eilene Ghazi Time): 06/09/2017 9:50:34 AM Confirm and document reason for call. If symptomatic, describe symptoms. ---Caller states, has a cough off an on for 2 weeks. Coughing up some phlegm. No fever. Does the PT have any chronic conditions? (i.e. diabetes, asthma, etc.) ---No Guidelines Guideline Title Affirmed Question Cough - Acute  Non- Productive Cough Disp. Time Eilene Ghazi Time) Disposition Final User 06/09/2017 9:56:12 AM Home Care Yes Dimas Chyle, RN, [facility] Advice Given Per Guideline CARE ADVICE given per Cough - Acute Non-Productive (Adult) guideline. * You become worse. * Difficulty breathing occurs * Fever lasts more than 3 days * Fever over 103 F (39.4 C) * Continuous coughing persists over 2 hours after cough treatment * Cough lasts over 3 weeks CALL BACK IF: * Suck on cough drops or hard candy to coat the irritated throat. * Drink warm fluids. Inhale warm mist. (Reason: both relax the airway and loosen up the phlegm) COUGHING SPELLS: AVOID TOBACCO SMOKE: Smoking or being exposed to smoke makes coughs much worse. HUMIDIFIER: If the air is dry, use a humidifier in the bedroom. (Reason: dry air makes coughs worse) CAUTION - DEXTROMETHORPHAN: * Do not try to completely suppress coughs that produce mucus and phlegm. Remember that coughing is helpful in bringing up mucus from the lungs and preventing pneumonia. * Research Notes: Dextromethorphan in some research studies has been shown to reduce the frequency and severity of cough in adults (18 years or older) without significant adverse effects. However, other studies suggest that dextromethorphan is no better than placebo at reducing a cough. * Drug Abuse Potential: It should be noted that dextromethorphan has become a drug of abuse. This problem is seen most often in adolescents. Overdose symptoms can range from giggling and euphoria to hallucinations and coma. * CONTRAINDICATED: Do not take dextromethorphan if you are taking a monoamine oxidase (MAO) inhibitor now or in the past 2 weeks. Examples of MAO inhibitors include isocarboxazid (Marplan), phenelzine (Nardil), selegiline (Eldepryl, Emsam, Zelapar), and tranylcypromine (Parnate). Do not take dextromethorphan if you are taking venlafaxine (Effexor). * Read the package instructions for dosage, contraindications,  and other  important information. * Examples: Benylin, Robitussin DM, Vicks 44 Cough Relief * Cough syrups containing the cough suppressant dextromethorphan (DM) may help decrease your cough. Cough syrups work PLEASE NOTE: All timestamps contained within this report are represented as [place] Standard Time. CONFIDENTIALTY NOTICE: This fax transmission is intended only for the addressee. It contains information that is legally privileged, confidential or otherwise protected from use or disclosure. If you are not the intended recipient, you are strictly prohibited from reviewing, disclosing, copying using or disseminating any of this information or taking any action in reliance on or regarding this information. If you have received this fax in error, please notify us immediately by telephone so that we can arrange for its return to [place]. Phone: [phone], Toll-Free: [phone], Fax: [phone] Page: 2 of 2 Call Id: [id number] Care Advice Given Per Guideline best for coughs that keep you awake at night. They can also sometimes help in the late stages of a respiratory infection when the cough is dry and hacking. They can be used along with cough drops. OTC COUGH SYRUP - DEXTROMETHORPHAN: * HOME REMEDY - HONEY: This old home remedy has been shown to help decrease coughing at night. The adult dosage is 2 teaspoons (10 ml) at bedtime. Honey should not be given to infants under one year of age. * HOME REMEDY - HARD CANDY: Hard candy works just as well as medicine-flavored OTC cough drops. People who have diabetes should use sugar-free candy. * OTC COUGH DROPS: Cough drops can help a lot, especially for mild coughs. They reduce coughing by soothing your irritated throat and removing that tickle sensation in the back of the throat. Cough drops also have the advantage of portability - you can carry them with you. * OTC COUGH SYRUPS: The most common cough suppressant in OTC cough medications is  dextromethorphan. Often the letters 'DM' appear in the name. COUGH MEDICINES: HOME REMEDY - HARD CANDY: Hard candy works just as well as a medicine-flavored OTC cough drops. * Cough drops are available over-the-counter (OTC). * Cough drops also have the advantage of portability - you can carry them with you. * Cough drops can help a lot, especially for mild coughs. They reduce coughing by soothing your irritated throat and removing that tickle sensation in the back of the throat. COUGH DROPS FOR COUGH: * You can also get a cough after being exposed to irritating substances like smoke, strong perfumes, and dust. * You can get a dry hacking cough after a chest cold. Sometimes this type of cough can last 1-3 weeks, and be worse at night. * Coughing is the way that our lungs remove irritants and mucus. It helps protect our lungs from getting pneumonia. REASSURANCE AND EDUCATION: HOME CARE: You should be able to treat this at home.

## 2017-07-14 ENCOUNTER — Encounter: Payer: Self-pay | Admitting: Family Medicine

## 2017-07-14 ENCOUNTER — Ambulatory Visit (INDEPENDENT_AMBULATORY_CARE_PROVIDER_SITE_OTHER): Payer: BLUE CROSS/BLUE SHIELD | Admitting: Family Medicine

## 2017-07-14 DIAGNOSIS — R059 Cough, unspecified: Secondary | ICD-10-CM

## 2017-07-14 DIAGNOSIS — R05 Cough: Secondary | ICD-10-CM | POA: Diagnosis not present

## 2017-07-14 NOTE — Patient Instructions (Signed)
Start zyrtec at bedtime.  Push fluids, rest. Can stop robitussin given nausea. Call if fever, unilateral face pain or not improving in 5-7 days.

## 2017-07-14 NOTE — Assessment & Plan Note (Signed)
Likely due to allergies vs viral infeciton.. Trial of zyrtec. Rest, fluids.

## 2017-07-14 NOTE — Progress Notes (Signed)
   Subjective:    Patient ID: Denise Roy, female    DOB: [birth year], 60 y.o.   MRN: [id number]  Cough  This is a new problem. The current episode started more than 1 month ago (had initial viral URI in 05/2017.. went away after 4 weeks, but returned in last week). The problem has been waxing and waning. The cough is productive of sputum. Associated symptoms include nasal congestion and a sore throat. Pertinent negatives include no chills, ear congestion, ear pain, fever, postnasal drip, shortness of breath or wheezing. Associated symptoms comments: Redness on posterior throat. Exacerbated by: not keeping im up at night. Risk factors: nonsmoker. She has tried OTC cough suppressant (robitussin DM) for the symptoms. There is no history of asthma, bronchitis, COPD, emphysema or pneumonia.      Review of Systems  Constitutional: Negative for chills and fever.  HENT: Positive for sore throat. Negative for ear pain and postnasal drip.   Respiratory: Positive for cough. Negative for shortness of breath and wheezing.        Objective:   Physical Exam  Constitutional: Vital signs are normal. She appears well-developed and well-nourished. She is cooperative.  Non-toxic appearance. She does not appear ill. No distress.  HENT:  Head: Normocephalic.  Right Ear: Hearing, tympanic membrane, external ear and ear canal normal. Tympanic membrane is not erythematous, not retracted and not bulging.  Left Ear: Hearing, tympanic membrane, external ear and ear canal normal. Tympanic membrane is not erythematous, not retracted and not bulging.  Nose: Mucosal edema and rhinorrhea present. Right sinus exhibits no maxillary sinus tenderness and no frontal sinus tenderness. Left sinus exhibits no maxillary sinus tenderness and no frontal sinus tenderness.  Mouth/Throat: Uvula is midline and mucous membranes are normal. Posterior oropharyngeal erythema present.  Eyes: Pupils are equal, round, and reactive to light.  Conjunctivae, EOM and lids are normal. Lids are everted and swept, no foreign bodies found.  Neck: Trachea normal and normal range of motion. Neck supple. Carotid bruit is not present. No thyroid mass and no thyromegaly present.  Cardiovascular: Normal rate, regular rhythm, S1 normal, S2 normal, normal heart sounds, intact distal pulses and normal pulses.  Exam reveals no gallop and no friction rub.   No murmur heard. Pulmonary/Chest: Effort normal and breath sounds normal. No tachypnea. No respiratory distress. She has no decreased breath sounds. She has no wheezes. She has no rhonchi. She has no rales.  Neurological: She is alert.  Skin: Skin is warm, dry and intact. No rash noted.  Psychiatric: Her speech is normal and behavior is normal. Judgment normal. Her mood appears not anxious. Cognition and memory are normal. She does not exhibit a depressed mood.          Assessment & Plan:

## 2017-09-13 ENCOUNTER — Telehealth: Payer: Self-pay | Admitting: *Deleted

## 2017-09-13 DIAGNOSIS — Z1239 Encounter for other screening for malignant neoplasm of breast: Secondary | ICD-10-CM

## 2017-09-13 NOTE — Telephone Encounter (Signed)
Which breast imaging facility does she use?

## 2017-09-13 NOTE — Telephone Encounter (Signed)
Copied from [place] #[id number]. Topic: Referral - Request >> Sep 13, 2017  4:57 PM Conception Chancy, NT wrote: Reason for CRM: pt wants to get a mammagram done and would like to have a referral to breast imaging. Contact pt if any questions.

## 2017-09-14 NOTE — Telephone Encounter (Signed)
Noted. Thank you- order entered.

## 2017-09-14 NOTE — Telephone Encounter (Signed)
TA-Looks like all previous Mammograms over 10 years have been at "[facility]"/plz advise/thx dmf

## 2017-10-06 DIAGNOSIS — Z1231 Encounter for screening mammogram for malignant neoplasm of breast: Secondary | ICD-10-CM | POA: Diagnosis not present

## 2017-10-09 ENCOUNTER — Encounter: Payer: Self-pay | Admitting: Family Medicine

## 2017-11-09 ENCOUNTER — Encounter: Payer: Self-pay | Admitting: Primary Care

## 2017-11-09 ENCOUNTER — Ambulatory Visit: Payer: BLUE CROSS/BLUE SHIELD | Admitting: Primary Care

## 2017-11-09 VITALS — BP 104/70 | HR 53 | Temp 98.1°F | Ht 60.75 in | Wt 115.4 lb

## 2017-11-09 DIAGNOSIS — Z0000 Encounter for general adult medical examination without abnormal findings: Secondary | ICD-10-CM

## 2017-11-09 DIAGNOSIS — D509 Iron deficiency anemia, unspecified: Secondary | ICD-10-CM | POA: Diagnosis not present

## 2017-11-09 NOTE — Patient Instructions (Signed)
Stop by the lab prior to leaving today. I will notify you of your results once received.   Start exercising. You should be getting 150 minutes of moderate intensity exercise weekly.  Increase consumption of vegetables, fruit, whole grains.  Ensure you are consuming 64 ounces of water daily.  Please notify me if you need help with your colonoscopy referral.  Follow up in 1 year for your annual exam or sooner if needed.  Schedule a lab only appointment to return fasting for labs. Do not eat 8 hours prior. You may have water and black coffee.  It was a pleasure meeting you!   Preventive Care 40-64 Years, Female Preventive care refers to lifestyle choices and visits with your health care provider that can promote health and wellness. What does preventive care include?  A yearly physical exam. This is also called an annual well check.  Dental exams once or twice a year.  Routine eye exams. Ask your health care provider how often you should have your eyes checked.  Personal lifestyle choices, including: ? Daily care of your teeth and gums. ? Regular physical activity. ? Eating a healthy diet. ? Avoiding tobacco and drug use. ? Limiting alcohol use. ? Practicing safe sex. ? Taking low-dose aspirin daily starting at age 63. ? Taking vitamin and mineral supplements as recommended by your health care provider. What happens during an annual well check? The services and screenings done by your health care provider during your annual well check will depend on your age, overall health, lifestyle risk factors, and family history of disease. Counseling Your health care provider may ask you questions about your:  Alcohol use.  Tobacco use.  Drug use.  Emotional well-being.  Home and relationship well-being.  Sexual activity.  Eating habits.  Work and work Statistician.  Method of birth control.  Menstrual cycle.  Pregnancy history.  Screening You may have the following  tests or measurements:  Height, weight, and BMI.  Blood pressure.  Lipid and cholesterol levels. These may be checked every 5 years, or more frequently if you are over 80 years old.  Skin check.  Lung cancer screening. You may have this screening every year starting at age 76 if you have a 30-pack-year history of smoking and currently smoke or have quit within the past 15 years.  Fecal occult blood test (FOBT) of the stool. You may have this test every year starting at age 12.  Flexible sigmoidoscopy or colonoscopy. You may have a sigmoidoscopy every 5 years or a colonoscopy every 10 years starting at age 73.  Hepatitis C blood test.  Hepatitis B blood test.  Sexually transmitted disease (STD) testing.  Diabetes screening. This is done by checking your blood sugar (glucose) after you have not eaten for a while (fasting). You may have this done every 1-3 years.  Mammogram. This may be done every 1-2 years. Talk to your health care provider about when you should start having regular mammograms. This may depend on whether you have a family history of breast cancer.  BRCA-related cancer screening. This may be done if you have a family history of breast, ovarian, tubal, or peritoneal cancers.  Pelvic exam and Pap test. This may be done every 3 years starting at age 16. Starting at age 30, this may be done every 5 years if you have a Pap test in combination with an HPV test.  Bone density scan. This is done to screen for osteoporosis. You may have this scan  if you are at high risk for osteoporosis.  Discuss your test results, treatment options, and if necessary, the need for more tests with your health care provider. Vaccines Your health care provider may recommend certain vaccines, such as:  Influenza vaccine. This is recommended every year.  Tetanus, diphtheria, and acellular pertussis (Tdap, Td) vaccine. You may need a Td booster every 10 years.  Varicella vaccine. You may need  this if you have not been vaccinated.  Zoster vaccine. You may need this after age 46.  Measles, mumps, and rubella (MMR) vaccine. You may need at least one dose of MMR if you were born in 1957 or later. You may also need a second dose.  Pneumococcal 13-valent conjugate (PCV13) vaccine. You may need this if you have certain conditions and were not previously vaccinated.  Pneumococcal polysaccharide (PPSV23) vaccine. You may need one or two doses if you smoke cigarettes or if you have certain conditions.  Meningococcal vaccine. You may need this if you have certain conditions.  Hepatitis A vaccine. You may need this if you have certain conditions or if you travel or work in places where you may be exposed to hepatitis A.  Hepatitis B vaccine. You may need this if you have certain conditions or if you travel or work in places where you may be exposed to hepatitis B.  Haemophilus influenzae type b (Hib) vaccine. You may need this if you have certain conditions.  Talk to your health care provider about which screenings and vaccines you need and how often you need them. This information is not intended to replace advice given to you by your health care provider. Make sure you discuss any questions you have with your health care provider. Document Released: 11/20/2015 Document Revised: 07/13/2016 Document Reviewed: 08/25/2015 Elsevier Interactive Patient Education  [employer].

## 2017-11-09 NOTE — Progress Notes (Signed)
Subjective:    Patient ID: Denise Roy, female    DOB: 14-Mar-1956, 62 y.o.   MRN: [id number]  HPI  Denise Roy is a 62 year old female who presents today to transfer care from Dr. Deborra Medina and for complete physical.   Immunizations: -Tetanus: Completed over 10 years. Declines.  -Influenza: Declines -Shingles: Completed in 2017  Diet:  She endorses a fair diet.  Breakfast: Skips Lunch: Meat, vegetables, casseroles Dinner: Meat, vegetables, starch Snacks: Crackers, nuts, fruit Desserts: Daily Beverages: Sweet tea, little water, sometimes juice, coffee  Exercise: She is not currently exercising. Tries to ride her bike once weekly. Eye exam: Completed in 2017 Dental exam: Completes semi-annually Colonoscopy: Completed in 2012, polyps. Due again. Pap Smear: Completed in 2017 Mammogram: Completed in 2018.    Review of Systems  Constitutional: Negative for unexpected weight change.  HENT: Negative for rhinorrhea.   Respiratory: Negative for cough and shortness of breath.   Cardiovascular: Negative for chest pain.  Gastrointestinal: Negative for constipation and diarrhea.  Genitourinary: Negative for difficulty urinating and menstrual problem.  Musculoskeletal: Negative for arthralgias and myalgias.  Skin: Negative for rash.  Allergic/Immunologic: Negative for environmental allergies.  Neurological: Negative for dizziness, numbness and headaches.  Psychiatric/Behavioral:       Denies concerns for anxiety or depression       No past medical history on file.   Social History   Socioeconomic History  . Marital status: Married    Spouse name: Not on file  . Number of children: Not on file  . Years of education: Not on file  . Highest education level: Not on file  Social Needs  . Financial resource strain: Not on file  . Food insecurity - worry: Not on file  . Food insecurity - inability: Not on file  . Transportation needs - medical: Not on file  . Transportation  needs - non-medical: Not on file  Occupational History  . Not on file  Tobacco Use  . Smoking status: Never Smoker  . Smokeless tobacco: Never Used  Substance and Sexual Activity  . Alcohol use: No  . Drug use: No  . Sexual activity: No    Birth control/protection: Post-menopausal  Other Topics Concern  . Not on file  Social History Narrative  . Not on file    No past surgical history on file.  Family History  Problem Relation Age of Onset  . Hypertension Mother   . Cancer Father     Allergies  Allergen Reactions  . Erythromycin     Stomach upset    Current Outpatient Medications on File Prior to Visit  Medication Sig Dispense Refill  . BIOTIN PO Take 500 mg's by mouth daily    . Multiple Vitamin (MULTIVITAMIN) tablet Take 1 tablet by mouth daily.     No current facility-administered medications on file prior to visit.     BP 104/70   Pulse (!) 53   Temp 98.1 F (36.7 C) (Oral)   Ht 5' 0.75" (1.543 m)   Wt 115 lb 6.4 oz (52.3 kg)   SpO2 98%   BMI 21.98 kg/m    Objective:   Physical Exam  Constitutional: She is oriented to person, place, and time. She appears well-nourished.  HENT:  Right Ear: Tympanic membrane and ear canal normal.  Left Ear: Tympanic membrane and ear canal normal.  Nose: Nose normal.  Mouth/Throat: Oropharynx is clear and moist.  Eyes: Conjunctivae and EOM are normal. Pupils are equal,  round, and reactive to light.  Neck: Neck supple. No thyromegaly present.  Cardiovascular: Normal rate and regular rhythm.  No murmur heard. Pulmonary/Chest: Effort normal and breath sounds normal. She has no rales.  Abdominal: Soft. Bowel sounds are normal. There is no tenderness.  Musculoskeletal: Normal range of motion.  Lymphadenopathy:    She has no cervical adenopathy.  Neurological: She is alert and oriented to person, place, and time. She has normal reflexes. No cranial nerve deficit.  Skin: Skin is warm and dry. No rash noted.  Psychiatric:  She has a normal mood and affect.          Assessment & Plan:

## 2017-11-09 NOTE — Assessment & Plan Note (Signed)
Td and influenza vaccinations due, she declines today. Pap smear UTD. Mammogram UTD. Colonoscopy due, she will scheduled.  Recommended to increase water, vegetables, fruit, whole grains. Start exercising. Exam unremarkable. Labs pending, she will return fasting. Follow up in 1 year.

## 2017-11-09 NOTE — Assessment & Plan Note (Signed)
Diagnosed per prior PCP, taking prenatal vitamins OTC.

## 2017-11-16 ENCOUNTER — Other Ambulatory Visit: Payer: Self-pay | Admitting: Primary Care

## 2017-11-16 ENCOUNTER — Other Ambulatory Visit (INDEPENDENT_AMBULATORY_CARE_PROVIDER_SITE_OTHER): Payer: BLUE CROSS/BLUE SHIELD

## 2017-11-16 DIAGNOSIS — R739 Hyperglycemia, unspecified: Secondary | ICD-10-CM

## 2017-11-16 DIAGNOSIS — D509 Iron deficiency anemia, unspecified: Secondary | ICD-10-CM | POA: Diagnosis not present

## 2017-11-16 DIAGNOSIS — N289 Disorder of kidney and ureter, unspecified: Secondary | ICD-10-CM

## 2017-11-16 DIAGNOSIS — Z0000 Encounter for general adult medical examination without abnormal findings: Secondary | ICD-10-CM | POA: Diagnosis not present

## 2017-11-16 LAB — COMPREHENSIVE METABOLIC PANEL
ALT: 18 U/L (ref 0–35)
AST: 18 U/L (ref 0–37)
Albumin: 4.6 g/dL (ref 3.5–5.2)
Alkaline Phosphatase: 81 U/L (ref 39–117)
BUN: 15 mg/dL (ref 6–23)
CHLORIDE: 103 meq/L (ref 96–112)
CO2: 30 meq/L (ref 19–32)
CREATININE: 1 mg/dL (ref 0.40–1.20)
Calcium: 9.7 mg/dL (ref 8.4–10.5)
GFR: 59.76 mL/min — ABNORMAL LOW (ref >60.00)
GLUCOSE: 107 mg/dL — AB (ref 70–99)
Potassium: 4.1 mEq/L (ref 3.5–5.1)
SODIUM: 141 meq/L (ref 135–145)
Total Bilirubin: 0.8 mg/dL (ref 0.2–1.2)
Total Protein: 7.5 g/dL (ref 6.0–8.3)

## 2017-11-16 LAB — CBC
HEMATOCRIT: 40.3 % (ref 36.0–46.0)
Hemoglobin: 13.2 g/dL (ref 12.0–15.0)
MCHC: 32.8 g/dL (ref 30.0–36.0)
MCV: 92.2 fl (ref 78.0–100.0)
PLATELETS: 293 10*3/uL (ref 150.0–400.0)
RBC: 4.38 Mil/uL (ref 3.87–5.11)
RDW: 13.3 % (ref 11.5–15.5)
WBC: 5.6 10*3/uL (ref 4.0–10.5)

## 2017-11-16 LAB — LIPID PANEL
CHOLESTEROL: 215 mg/dL — AB (ref 0–200)
HDL: 73.3 mg/dL (ref >39.00)
LDL CALC: 120 mg/dL — AB (ref 0–99)
NONHDL: 141.61
Total CHOL/HDL Ratio: 3
Triglycerides: 108 mg/dL (ref 0.0–149.0)
VLDL: 21.6 mg/dL (ref 0.0–40.0)

## 2017-12-18 ENCOUNTER — Emergency Department: Payer: BLUE CROSS/BLUE SHIELD

## 2017-12-18 ENCOUNTER — Other Ambulatory Visit: Payer: Self-pay

## 2017-12-18 ENCOUNTER — Emergency Department
Admission: EM | Admit: 2017-12-18 | Discharge: 2017-12-18 | Disposition: A | Discharge status: Home / Self Care | Payer: BLUE CROSS/BLUE SHIELD | Attending: Emergency Medicine | Admitting: Emergency Medicine

## 2017-12-18 ENCOUNTER — Encounter: Payer: Self-pay | Admitting: Emergency Medicine

## 2017-12-18 DIAGNOSIS — S6992XA Unspecified injury of left wrist, hand and finger(s), initial encounter: Secondary | ICD-10-CM | POA: Diagnosis present

## 2017-12-18 DIAGNOSIS — W228XXA Striking against or struck by other objects, initial encounter: Secondary | ICD-10-CM | POA: Insufficient documentation

## 2017-12-18 DIAGNOSIS — Z23 Encounter for immunization: Secondary | ICD-10-CM | POA: Insufficient documentation

## 2017-12-18 DIAGNOSIS — S61215A Laceration without foreign body of left ring finger without damage to nail, initial encounter: Secondary | ICD-10-CM | POA: Diagnosis not present

## 2017-12-18 DIAGNOSIS — Z79899 Other long term (current) drug therapy: Secondary | ICD-10-CM | POA: Insufficient documentation

## 2017-12-18 DIAGNOSIS — Y939 Activity, unspecified: Secondary | ICD-10-CM | POA: Insufficient documentation

## 2017-12-18 DIAGNOSIS — Y999 Unspecified external cause status: Secondary | ICD-10-CM | POA: Diagnosis not present

## 2017-12-18 DIAGNOSIS — S62613A Displaced fracture of proximal phalanx of left middle finger, initial encounter for closed fracture: Secondary | ICD-10-CM

## 2017-12-18 DIAGNOSIS — Y929 Unspecified place or not applicable: Secondary | ICD-10-CM | POA: Insufficient documentation

## 2017-12-18 DIAGNOSIS — S62617A Displaced fracture of proximal phalanx of left little finger, initial encounter for closed fracture: Secondary | ICD-10-CM | POA: Diagnosis not present

## 2017-12-18 DIAGNOSIS — S62603A Fracture of unspecified phalanx of left middle finger, initial encounter for closed fracture: Secondary | ICD-10-CM | POA: Diagnosis not present

## 2017-12-18 DIAGNOSIS — S62601A Fracture of unspecified phalanx of left index finger, initial encounter for closed fracture: Secondary | ICD-10-CM | POA: Diagnosis not present

## 2017-12-18 DIAGNOSIS — S61214A Laceration without foreign body of right ring finger without damage to nail, initial encounter: Secondary | ICD-10-CM | POA: Insufficient documentation

## 2017-12-18 DIAGNOSIS — S62611A Displaced fracture of proximal phalanx of left index finger, initial encounter for closed fracture: Secondary | ICD-10-CM

## 2017-12-18 MED ORDER — NAPROXEN 500 MG PO TABS
500.0000 mg | ORAL_TABLET | Freq: Once | ORAL | Status: AC
  Administered 2017-12-18: 500 mg via ORAL
  Filled 2017-12-18: qty 1

## 2017-12-18 MED ORDER — BACITRACIN ZINC 500 UNIT/GM EX OINT
TOPICAL_OINTMENT | CUTANEOUS | Status: DC
Start: 2017-12-18 — End: 2017-12-18
  Filled 2017-12-18: qty 0.9

## 2017-12-18 MED ORDER — NAPROXEN 500 MG PO TABS: 500.0000 mg | ORAL_TABLET | Freq: Two times a day (BID) | ORAL | 0 refills | Status: DC

## 2017-12-18 MED ORDER — TRAMADOL HCL 50 MG PO TABS: 50.0000 mg | ORAL_TABLET | Freq: Four times a day (QID) | ORAL | 0 refills | Status: DC | PRN

## 2017-12-18 MED ORDER — TETANUS-DIPHTH-ACELL PERTUSSIS 5-2.5-18.5 LF-MCG/0.5 IM SUSP
0.5000 mL | Freq: Once | INTRAMUSCULAR | Status: AC
  Administered 2017-12-18: 0.5 mL via INTRAMUSCULAR
  Filled 2017-12-18: qty 0.5

## 2017-12-18 MED ORDER — LIDOCAINE HCL (PF) 1 % IJ SOLN
INTRAMUSCULAR | Status: AC
  Filled 2017-12-18: qty 5

## 2017-12-18 MED ORDER — TRAMADOL HCL 50 MG PO TABS
50.0000 mg | ORAL_TABLET | Freq: Once | ORAL | Status: AC
  Administered 2017-12-18: 50 mg via ORAL
  Filled 2017-12-18: qty 1

## 2017-12-18 NOTE — ED Notes (Signed)
ED Provider at bedside. 

## 2017-12-18 NOTE — ED Triage Notes (Signed)
States she was helping to unload some wood  The wood was wet  Slipped and hit her left hand   States she caught it on the trailer  Swelling noted to left 2nd,3rd and 4th fingers with 2 small lacerations

## 2017-12-18 NOTE — Discharge Instructions (Signed)
Wear splint until evaluation by orthopedics.  Call in the morning to schedule follow-up appointment.

## 2017-12-18 NOTE — ED Provider Notes (Signed)
[facility] Emergency Department Provider Note   ____________________________________________   First MD Initiated Contact with Patient 12/18/17 1808     (approximate)  I have reviewed the triage vital signs and the nursing notes.   HISTORY  Chief Complaint Hand Injury    HPI KINDLE STROHMEIER is a 62 y.o. female complain of left hand pain secondary to a contusion.  Patient state she was unloading some wood which was wet.  Patient slipped and hit her hand on the back of a trailer.  Patient has edema from the second to the fourth digit.  Laceration to the second and fourth digit.  Patient denies loss of sensation or function of the affected digits.  Patient rates the pain is a 4/10.  Patient described the pain is "achy".  Bleeding controlled by direct pressure to wound.  History reviewed. No pertinent past medical history.  Patient Active Problem List   Diagnosis Date Noted  . Iron deficiency anemia 11/09/2017  . Cough 07/14/2017  . Preventative health care 05/24/2012    History reviewed. No pertinent surgical history.  Prior to Admission medications   Medication Sig Start Date End Date Taking? Authorizing Provider  BIOTIN PO Take 500 mg's by mouth daily    [provider]  Multiple Vitamin (MULTIVITAMIN) tablet Take 1 tablet by mouth daily.    [provider]  naproxen (NAPROSYN) 500 MG tablet Take 1 tablet (500 mg total) by mouth 2 (two) times daily with a meal. 12/18/17   Sable Feil, PA-C  traMADol (ULTRAM) 50 MG tablet Take 1 tablet (50 mg total) by mouth every 6 (six) hours as needed. 12/18/17 12/18/18  Sable Feil, PA-C    Allergies Erythromycin  Family History  Problem Relation Age of Onset  . Hypertension Mother   . Cancer Father     Social History Social History   Tobacco Use  . Smoking status: Never Smoker  . Smokeless tobacco: Never Used  Substance Use Topics  . Alcohol use: No  . Drug use: No     Review of Systems Constitutional: No fever/chills Eyes: No visual changes. ENT: No sore throat. Cardiovascular: Denies chest pain. Respiratory: Denies shortness of breath. Gastrointestinal: No abdominal pain.  No nausea, no vomiting.  No diarrhea.  No constipation. Genitourinary: Negative for dysuria. Musculoskeletal: Left hand pain Skin: Negative for rash.  Laceration to l fourth digit left hand. Neurological: Negative for headaches, focal weakness or numbness. Endocrine:Hypertension Allergic/Immunilogical: Erythromycin ____________________________________________   PHYSICAL EXAM:  VITAL SIGNS: ED Triage Vitals  Enc Vitals Group     BP      Pulse      Resp      Temp      Temp src      SpO2      Weight      Height      Head Circumference      Peak Flow      Pain Score      Pain Loc      Pain Edu?      Excl. in [place]?    Constitutional: Alert and oriented. Well appearing and in no acute distress. Cardiovascular: Normal rate, regular rhythm. Grossly normal heart sounds.  Good peripheral circulation. Respiratory: Normal respiratory effort.  No retractions. Lungs CTAB. Musculoskeletal: Edema to second through fourth digit left hand.. Neurologic:  Normal speech and language. No gross focal neurologic deficits are appreciated. No gait instability. Skin: Laceration to the third and fourth  digit left hand.   Psychiatric: Mood and affect are normal. Speech and behavior are normal.  ____________________________________________   LABS (all labs ordered are listed, but only abnormal results are displayed)  Labs Reviewed - No data to display ____________________________________________  EKG   ____________________________________________  RADIOLOGY  ED MD interpretation: Fracture to th the second and third digit left hand. Official radiology report(s): Dg Hand Complete Left  Result Date: 12/18/2017 CLINICAL DATA:  Crush injury EXAM: LEFT HAND - COMPLETE 3+ VIEW  COMPARISON:  None. FINDINGS: Comminuted fractures of the second and third proximal phalanges of the left hand. There is dorsal angulation of the second proximal phalanx fracture. There are metallic fragments overlying the fourth proximal phalanx, which are likely fragments of the ring that was cut off following the acquisition of the initial image. There is general moderate soft tissue swelling of the proximal fingers and at the metacarpophalangeal joints. IMPRESSION: Comminuted fractures of the proximal phalanges of the left second and third fingers with associated moderate soft tissue swelling. Electronically Signed   By: Ulyses Jarred M.D.   On: 12/18/2017 19:15    ____________________________________________   PROCEDURES  Procedure(s) performed: None  .Marland KitchenLaceration Repair Date/Time: 12/18/2017 6:57 PM Performed by: Sable Feil, PA-C Authorized by: Sable Feil, PA-C   Consent:    Consent obtained:  Verbal   Consent given by:  Patient   Risks discussed:  Infection and pain Anesthesia (see MAR for exact dosages):    Anesthesia method:  Nerve block   Block anesthetic:  Lidocaine 1% w/o epi   Block injection procedure:  Anatomic landmarks identified   Block outcome:  Anesthesia achieved Laceration details:    Location:  Finger   Finger location:  L ring finger   Length (cm):  1 Repair type:    Repair type:  Simple Pre-procedure details:    Preparation:  Patient was prepped and draped in usual sterile fashion and imaging obtained to evaluate for foreign bodies Exploration:    Hemostasis achieved with:  Direct pressure   Contaminated: no   Treatment:    Area cleansed with:  Betadine and saline   Amount of cleaning:  Standard   Irrigation solution:  Sterile saline   Irrigation method:  Syringe Skin repair:    Repair method:  Sutures   Suture size:  4-0   Suture material:  Nylon   Suture technique:  Simple interrupted   Number of sutures:  6 Approximation:     Approximation:  Close Post-procedure details:    Dressing:  Antibiotic ointment and sterile dressing .Splint Application Date/Time: 0/07/3817 7:24 PM Performed by: Sable Feil, PA-C Authorized by: Sable Feil, PA-C      Critical Care performed: No  ____________________________________________   INITIAL IMPRESSION / ASSESSMENT AND PLAN / ED COURSE  As part of my medical decision making, I reviewed the following data within the [facility]    Patient was sent with edema to the left hand along with a laceration to the third and fourth digit secondary to contusion.  Patient sustained a fracture to the second and third digit of the left hand.  Discussed x-ray findings with patient.  Laceration to the third digit left hand was closed with 4-0 nylon sutures.  Superficial laceration to the fourth digit was closed with Dermabond.  Patient second and third digit was buddy taped and patient placed in a volar splint.  Patient given a tetanus shot.  Patient advised to follow-up with orthopedic  by calling for an appointment tomorrow morning.  Patient given a prescription for tramadol and naproxen.      ____________________________________________   FINAL CLINICAL IMPRESSION(S) / ED DIAGNOSES  Final diagnoses:  Laceration of right ring finger without foreign body without damage to nail, initial encounter  Laceration of left ring finger without foreign body without damage to nail, initial encounter  Closed displaced fracture of proximal phalanx of left index finger, initial encounter  Closed displaced fracture of proximal phalanx of left middle finger, initial encounter     ED Discharge Orders        Ordered    traMADol (ULTRAM) 50 MG tablet  Every 6 hours PRN     12/18/17 1935    naproxen (NAPROSYN) 500 MG tablet  2 times daily with meals     12/18/17 1935       Note:  This document was prepared using Dragon voice recognition software and may include  unintentional dictation errors.    Sable Feil, PA-C 12/18/17 1943    Hinda Kehr, MD 12/18/17 2034

## 2017-12-19 DIAGNOSIS — S62613A Displaced fracture of proximal phalanx of left middle finger, initial encounter for closed fracture: Secondary | ICD-10-CM | POA: Diagnosis not present

## 2017-12-19 DIAGNOSIS — S62611A Displaced fracture of proximal phalanx of left index finger, initial encounter for closed fracture: Secondary | ICD-10-CM | POA: Diagnosis not present

## 2017-12-19 DIAGNOSIS — S62611D Displaced fracture of proximal phalanx of left index finger, subsequent encounter for fracture with routine healing: Secondary | ICD-10-CM | POA: Diagnosis not present

## 2017-12-22 DIAGNOSIS — S62613A Displaced fracture of proximal phalanx of left middle finger, initial encounter for closed fracture: Secondary | ICD-10-CM | POA: Diagnosis not present

## 2017-12-22 DIAGNOSIS — S62623A Displaced fracture of medial phalanx of left middle finger, initial encounter for closed fracture: Secondary | ICD-10-CM | POA: Diagnosis not present

## 2017-12-22 DIAGNOSIS — S62621A Displaced fracture of medial phalanx of left index finger, initial encounter for closed fracture: Secondary | ICD-10-CM | POA: Diagnosis not present

## 2017-12-22 DIAGNOSIS — S62611A Displaced fracture of proximal phalanx of left index finger, initial encounter for closed fracture: Secondary | ICD-10-CM | POA: Diagnosis not present

## 2017-12-28 DIAGNOSIS — S62611D Displaced fracture of proximal phalanx of left index finger, subsequent encounter for fracture with routine healing: Secondary | ICD-10-CM | POA: Diagnosis not present

## 2017-12-28 DIAGNOSIS — S62613D Displaced fracture of proximal phalanx of left middle finger, subsequent encounter for fracture with routine healing: Secondary | ICD-10-CM | POA: Diagnosis not present

## 2018-01-02 DIAGNOSIS — M25641 Stiffness of right hand, not elsewhere classified: Secondary | ICD-10-CM | POA: Diagnosis not present

## 2018-01-10 ENCOUNTER — Other Ambulatory Visit (INDEPENDENT_AMBULATORY_CARE_PROVIDER_SITE_OTHER): Payer: BLUE CROSS/BLUE SHIELD

## 2018-01-10 DIAGNOSIS — N289 Disorder of kidney and ureter, unspecified: Secondary | ICD-10-CM

## 2018-01-10 DIAGNOSIS — R739 Hyperglycemia, unspecified: Secondary | ICD-10-CM

## 2018-01-10 LAB — BASIC METABOLIC PANEL
BUN: 13 mg/dL (ref 6–23)
CO2: 32 mEq/L (ref 19–32)
Calcium: 9.7 mg/dL (ref 8.4–10.5)
Chloride: 104 mEq/L (ref 96–112)
Creatinine, Ser: 0.91 mg/dL (ref 0.40–1.20)
GFR: 66.6 mL/min (ref >60.00)
Glucose, Bld: 87 mg/dL (ref 70–99)
POTASSIUM: 3.7 meq/L (ref 3.5–5.1)
SODIUM: 140 meq/L (ref 135–145)

## 2018-01-10 LAB — HEMOGLOBIN A1C: HEMOGLOBIN A1C: 5.6 % (ref 4.6–6.5)

## 2018-01-11 DIAGNOSIS — S62613D Displaced fracture of proximal phalanx of left middle finger, subsequent encounter for fracture with routine healing: Secondary | ICD-10-CM | POA: Diagnosis not present

## 2018-01-11 DIAGNOSIS — S62611D Displaced fracture of proximal phalanx of left index finger, subsequent encounter for fracture with routine healing: Secondary | ICD-10-CM | POA: Diagnosis not present

## 2018-01-16 DIAGNOSIS — S62611D Displaced fracture of proximal phalanx of left index finger, subsequent encounter for fracture with routine healing: Secondary | ICD-10-CM | POA: Diagnosis not present

## 2018-01-16 DIAGNOSIS — M25641 Stiffness of right hand, not elsewhere classified: Secondary | ICD-10-CM | POA: Diagnosis not present

## 2018-02-06 DIAGNOSIS — S62611D Displaced fracture of proximal phalanx of left index finger, subsequent encounter for fracture with routine healing: Secondary | ICD-10-CM | POA: Diagnosis not present

## 2018-02-06 DIAGNOSIS — S62613D Displaced fracture of proximal phalanx of left middle finger, subsequent encounter for fracture with routine healing: Secondary | ICD-10-CM | POA: Diagnosis not present

## 2018-02-15 DIAGNOSIS — S62611D Displaced fracture of proximal phalanx of left index finger, subsequent encounter for fracture with routine healing: Secondary | ICD-10-CM | POA: Diagnosis not present

## 2018-02-21 DIAGNOSIS — Z86010 Personal history of colonic polyps: Secondary | ICD-10-CM | POA: Diagnosis not present

## 2018-02-22 DIAGNOSIS — S62611D Displaced fracture of proximal phalanx of left index finger, subsequent encounter for fracture with routine healing: Secondary | ICD-10-CM | POA: Diagnosis not present

## 2018-03-01 DIAGNOSIS — S62611D Displaced fracture of proximal phalanx of left index finger, subsequent encounter for fracture with routine healing: Secondary | ICD-10-CM | POA: Diagnosis not present

## 2018-03-15 DIAGNOSIS — S62611D Displaced fracture of proximal phalanx of left index finger, subsequent encounter for fracture with routine healing: Secondary | ICD-10-CM | POA: Diagnosis not present

## 2018-04-10 DIAGNOSIS — D1801 Hemangioma of skin and subcutaneous tissue: Secondary | ICD-10-CM | POA: Diagnosis not present

## 2018-04-10 DIAGNOSIS — L814 Other melanin hyperpigmentation: Secondary | ICD-10-CM | POA: Diagnosis not present

## 2018-04-10 DIAGNOSIS — D225 Melanocytic nevi of trunk: Secondary | ICD-10-CM | POA: Diagnosis not present

## 2018-04-10 DIAGNOSIS — L821 Other seborrheic keratosis: Secondary | ICD-10-CM | POA: Diagnosis not present

## 2018-04-12 DIAGNOSIS — K640 First degree hemorrhoids: Secondary | ICD-10-CM | POA: Diagnosis not present

## 2018-04-12 DIAGNOSIS — K648 Other hemorrhoids: Secondary | ICD-10-CM | POA: Diagnosis not present

## 2018-04-12 DIAGNOSIS — Z86010 Personal history of colonic polyps: Secondary | ICD-10-CM | POA: Diagnosis not present

## 2018-04-12 DIAGNOSIS — K5730 Diverticulosis of large intestine without perforation or abscess without bleeding: Secondary | ICD-10-CM | POA: Diagnosis not present

## 2018-06-01 DIAGNOSIS — M65332 Trigger finger, left middle finger: Secondary | ICD-10-CM | POA: Diagnosis not present

## 2018-10-11 DIAGNOSIS — M65332 Trigger finger, left middle finger: Secondary | ICD-10-CM | POA: Diagnosis not present

## 2018-11-23 ENCOUNTER — Telehealth: Payer: Self-pay | Admitting: Primary Care

## 2018-11-23 NOTE — Telephone Encounter (Signed)
error 

## 2018-11-27 ENCOUNTER — Other Ambulatory Visit: Payer: Self-pay | Admitting: Primary Care

## 2018-11-27 DIAGNOSIS — D509 Iron deficiency anemia, unspecified: Secondary | ICD-10-CM

## 2018-11-27 DIAGNOSIS — Z0000 Encounter for general adult medical examination without abnormal findings: Secondary | ICD-10-CM

## 2018-11-30 DIAGNOSIS — Z1231 Encounter for screening mammogram for malignant neoplasm of breast: Secondary | ICD-10-CM | POA: Diagnosis not present

## 2018-11-30 LAB — HM MAMMOGRAPHY

## 2018-12-03 ENCOUNTER — Other Ambulatory Visit (INDEPENDENT_AMBULATORY_CARE_PROVIDER_SITE_OTHER): Payer: BLUE CROSS/BLUE SHIELD

## 2018-12-03 DIAGNOSIS — Z0000 Encounter for general adult medical examination without abnormal findings: Secondary | ICD-10-CM | POA: Diagnosis not present

## 2018-12-03 DIAGNOSIS — D509 Iron deficiency anemia, unspecified: Secondary | ICD-10-CM

## 2018-12-03 LAB — CBC
HEMATOCRIT: 38.6 % (ref 36.0–46.0)
Hemoglobin: 12.9 g/dL (ref 12.0–15.0)
MCHC: 33.4 g/dL (ref 30.0–36.0)
MCV: 91.8 fl (ref 78.0–100.0)
Platelets: 219 10*3/uL (ref 150.0–400.0)
RBC: 4.2 Mil/uL (ref 3.87–5.11)
RDW: 12.8 % (ref 11.5–15.5)
WBC: 4.1 10*3/uL (ref 4.0–10.5)

## 2018-12-03 LAB — COMPREHENSIVE METABOLIC PANEL
ALBUMIN: 4.3 g/dL (ref 3.5–5.2)
ALK PHOS: 79 U/L (ref 39–117)
ALT: 23 U/L (ref 0–35)
AST: 25 U/L (ref 0–37)
BUN: 10 mg/dL (ref 6–23)
CALCIUM: 9.6 mg/dL (ref 8.4–10.5)
CHLORIDE: 104 meq/L (ref 96–112)
CO2: 29 mEq/L (ref 19–32)
Creatinine, Ser: 0.93 mg/dL (ref 0.40–1.20)
GFR: 60.93 mL/min (ref >60.00)
Glucose, Bld: 100 mg/dL — ABNORMAL HIGH (ref 70–99)
Potassium: 4.2 mEq/L (ref 3.5–5.1)
Sodium: 139 mEq/L (ref 135–145)
TOTAL PROTEIN: 6.6 g/dL (ref 6.0–8.3)
Total Bilirubin: 0.4 mg/dL (ref 0.2–1.2)

## 2018-12-03 LAB — LIPID PANEL
CHOLESTEROL: 156 mg/dL (ref 0–200)
HDL: 56.1 mg/dL (ref >39.00)
LDL CALC: 81 mg/dL (ref 0–99)
NonHDL: 99.49
TRIGLYCERIDES: 90 mg/dL (ref 0.0–149.0)
Total CHOL/HDL Ratio: 3
VLDL: 18 mg/dL (ref 0.0–40.0)

## 2018-12-04 ENCOUNTER — Encounter: Payer: Self-pay | Admitting: Primary Care

## 2018-12-12 ENCOUNTER — Ambulatory Visit (INDEPENDENT_AMBULATORY_CARE_PROVIDER_SITE_OTHER): Payer: BLUE CROSS/BLUE SHIELD | Admitting: Primary Care

## 2018-12-12 ENCOUNTER — Encounter: Payer: Self-pay | Admitting: Primary Care

## 2018-12-12 ENCOUNTER — Other Ambulatory Visit (HOSPITAL_COMMUNITY)
Admission: RE | Admit: 2018-12-12 | Discharge: 2018-12-12 | Disposition: A | Discharge status: Home / Self Care | Payer: BLUE CROSS/BLUE SHIELD | Source: Ambulatory Visit | Attending: Internal Medicine | Admitting: Internal Medicine

## 2018-12-12 VITALS — BP 104/64 | HR 64 | Temp 98.3°F | Ht 61.0 in | Wt 110.2 lb

## 2018-12-12 DIAGNOSIS — D509 Iron deficiency anemia, unspecified: Secondary | ICD-10-CM | POA: Diagnosis not present

## 2018-12-12 DIAGNOSIS — Z124 Encounter for screening for malignant neoplasm of cervix: Secondary | ICD-10-CM | POA: Diagnosis not present

## 2018-12-12 DIAGNOSIS — Z0000 Encounter for general adult medical examination without abnormal findings: Secondary | ICD-10-CM

## 2018-12-12 NOTE — Assessment & Plan Note (Signed)
Immunizations UTD.  Pap smear due, completed today. Mammogram UTD. Colonoscopy UTD. Recommended she work on diet, start with regular exercise. Exam unremarkable. Labs reviewed. Follow up in 1 year for CPE.

## 2018-12-12 NOTE — Assessment & Plan Note (Addendum)
Recent CBC unremarkable. Asymptomatic.  Continue multivitamin.

## 2018-12-12 NOTE — Patient Instructions (Signed)
We will be in touch regarding your pap results in a few days.  Start exercising. You should be getting 150 minutes of moderate intensity exercise weekly.  It's important to improve your diet by reducing consumption of fast food, fried food, processed snack foods, sugary drinks. Increase consumption of fresh vegetables and fruits, whole grains, water.  Ensure you are drinking 64 ounces of water daily.  We will see you in one year for your annual exam or sooner if needed.  It was a pleasure to see you today!

## 2018-12-12 NOTE — Progress Notes (Signed)
Subjective:    Patient ID: Denise Roy, female    DOB: 10/09/1956, 63 y.o.   MRN: [id number]  HPI  Denise Roy is a 63 year old female who presents today for complete physical.  Immunizations: -Tetanus: Completed in 2019 -Influenza: Declines   -Shingles: Completed in 2017  Diet: She endorses a fair diet Breakfast: Oatmeal Lunch: Vegetables, meat Dinner: Vegetables, meat, sandwich  Snacks: Graham crackers, peanut butter, candy, nuts Desserts: Daily  Beverages: Sweet tea, some water, coffee, juice on occasion   Exercise: She is not exercising, active Eye exam: Completed in 2019 Dental exam: Completes semi-annually  Colonoscopy: Completed in 2019, due in  Pap Smear: Completed in 2017 Mammogram: Completed in January 2020 Hep C Screen:   Review of Systems  Constitutional: Negative for unexpected weight change.  HENT: Negative for rhinorrhea.   Respiratory: Negative for cough and shortness of breath.   Cardiovascular: Negative for chest pain.  Gastrointestinal: Negative for constipation and diarrhea.  Genitourinary: Negative for difficulty urinating.       Intermittent vaginal itching, improved with hydrocortisone   Musculoskeletal: Negative for arthralgias and myalgias.  Skin: Negative for rash.  Allergic/Immunologic: Negative for environmental allergies.  Neurological: Negative for dizziness, numbness and headaches.  Psychiatric/Behavioral: The patient is not nervous/anxious.        No past medical history on file.   Social History   Socioeconomic History  . Marital status: Married    Spouse name: Not on file  . Number of children: Not on file  . Years of education: Not on file  . Highest education level: Not on file  Occupational History  . Not on file  Social Needs  . Financial resource strain: Not on file  . Food insecurity:    Worry: Not on file    Inability: Not on file  . Transportation needs:    Medical: Not on file    Non-medical: Not on file    Tobacco Use  . Smoking status: Never Smoker  . Smokeless tobacco: Never Used  Substance and Sexual Activity  . Alcohol use: No  . Drug use: No  . Sexual activity: Never    Birth control/protection: Post-menopausal  Lifestyle  . Physical activity:    Days per week: Not on file    Minutes per session: Not on file  . Stress: Not on file  Relationships  . Social connections:    Talks on phone: Not on file    Gets together: Not on file    Attends religious service: Not on file    Active member of club or organization: Not on file    Attends meetings of clubs or organizations: Not on file    Relationship status: Not on file  . Intimate partner violence:    Fear of current or ex partner: Not on file    Emotionally abused: Not on file    Physically abused: Not on file    Forced sexual activity: Not on file  Other Topics Concern  . Not on file  Social History Narrative  . Not on file    No past surgical history on file.  Family History  Problem Relation Age of Onset  . Hypertension Mother   . Cancer Father     Allergies  Allergen Reactions  . Erythromycin     Stomach upset    Current Outpatient Medications on File Prior to Visit  Medication Sig Dispense Refill  . BIOTIN PO Take 500 mg's by mouth daily    .  Multiple Vitamin (MULTIVITAMIN) tablet Take 1 tablet by mouth daily.     No current facility-administered medications on file prior to visit.     BP 104/64   Pulse 64   Temp 98.3 F (36.8 C) (Oral)   Ht 5\' 1"  (1.549 m)   Wt 110 lb 4 oz (50 kg)   SpO2 98%   BMI 20.83 kg/m    Objective:   Physical Exam  Constitutional: She is oriented to person, place, and time. She appears well-nourished.  HENT:  Mouth/Throat: No oropharyngeal exudate.  Eyes: Pupils are equal, round, and reactive to light. EOM are normal.  Neck: Neck supple. No thyromegaly present.  Cardiovascular: Normal rate and regular rhythm.  Respiratory: Effort normal and breath sounds normal.   GI: Soft. Bowel sounds are normal. There is no abdominal tenderness.  Genitourinary: There is no tenderness or lesion on the right labia. There is no tenderness or lesion on the left labia. Cervix exhibits no motion tenderness and no discharge. Right adnexum displays no tenderness. Left adnexum displays no tenderness.    No vaginal discharge or erythema.  No erythema in the vagina.  Musculoskeletal: Normal range of motion.  Neurological: She is alert and oriented to person, place, and time.  Skin: Skin is warm and dry.  Psychiatric: She has a normal mood and affect.           Assessment & Plan:

## 2018-12-15 LAB — CYTOLOGY - PAP
Diagnosis: NEGATIVE
HPV: NOT DETECTED

## 2019-02-18 DIAGNOSIS — L820 Inflamed seborrheic keratosis: Secondary | ICD-10-CM | POA: Diagnosis not present

## 2019-02-18 DIAGNOSIS — L249 Irritant contact dermatitis, unspecified cause: Secondary | ICD-10-CM | POA: Diagnosis not present

## 2019-04-13 DIAGNOSIS — M2341 Loose body in knee, right knee: Secondary | ICD-10-CM | POA: Diagnosis not present

## 2019-04-15 DIAGNOSIS — M65261 Calcific tendinitis, right lower leg: Secondary | ICD-10-CM | POA: Diagnosis not present

## 2019-04-15 DIAGNOSIS — M25561 Pain in right knee: Secondary | ICD-10-CM | POA: Diagnosis not present

## 2019-04-21 DIAGNOSIS — M25561 Pain in right knee: Secondary | ICD-10-CM | POA: Diagnosis not present

## 2019-04-26 DIAGNOSIS — S83241A Other tear of medial meniscus, current injury, right knee, initial encounter: Secondary | ICD-10-CM | POA: Diagnosis not present

## 2019-06-24 IMAGING — DX DG HAND COMPLETE 3+V*L*
4 series · 4 of 4 positions shown · non-contrast
Comparison: None.

CLINICAL DATA: Crush injury

EXAM:
LEFT HAND - COMPLETE 3+ VIEW

[hand ap]
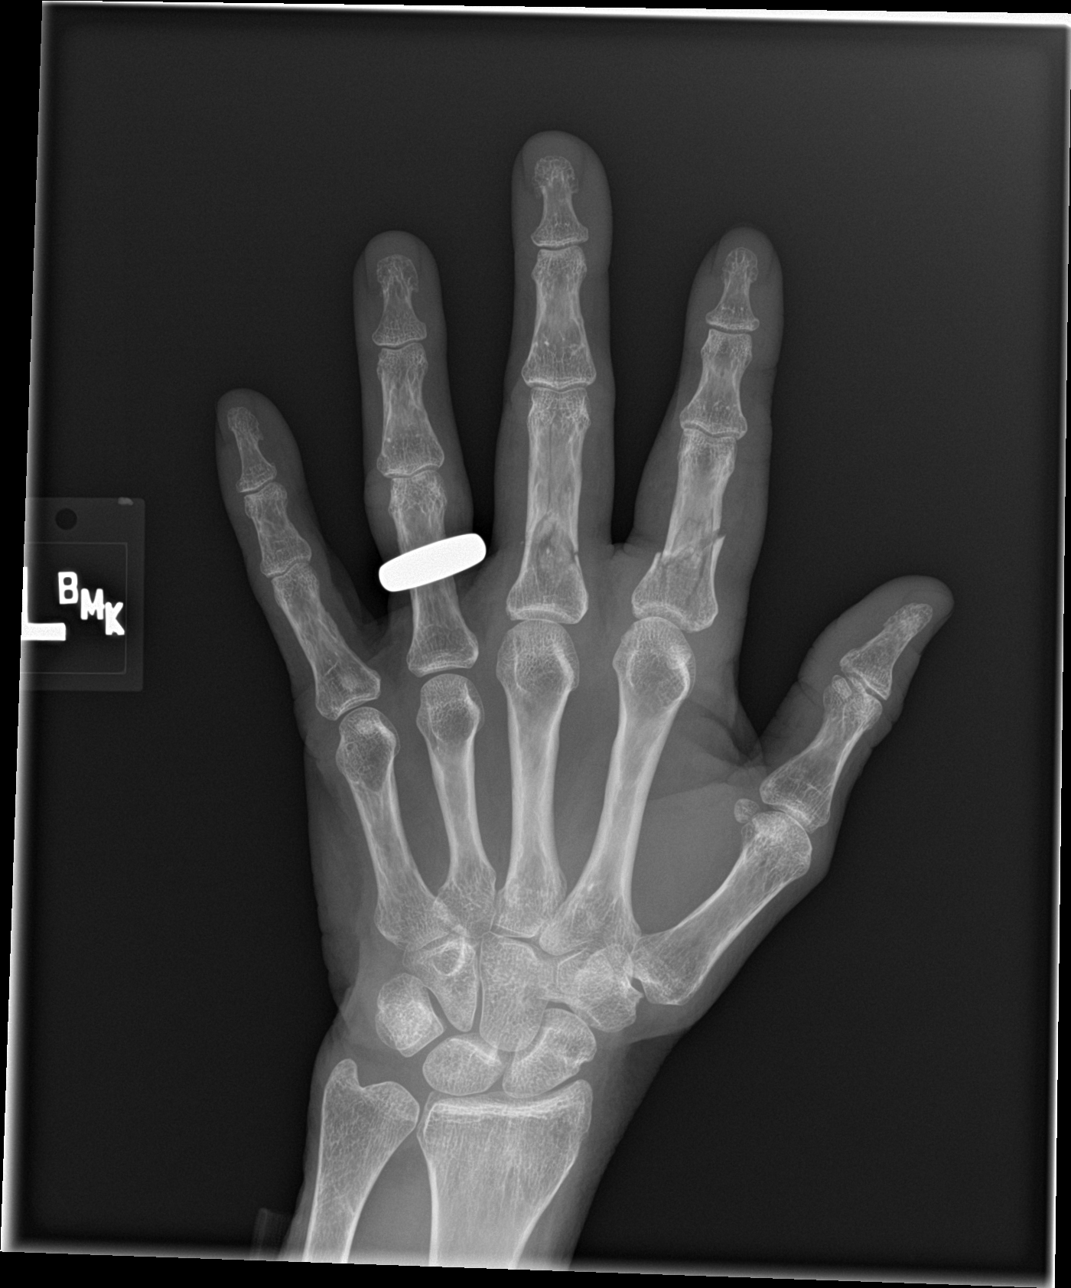

[hand obl (1 of 2)]
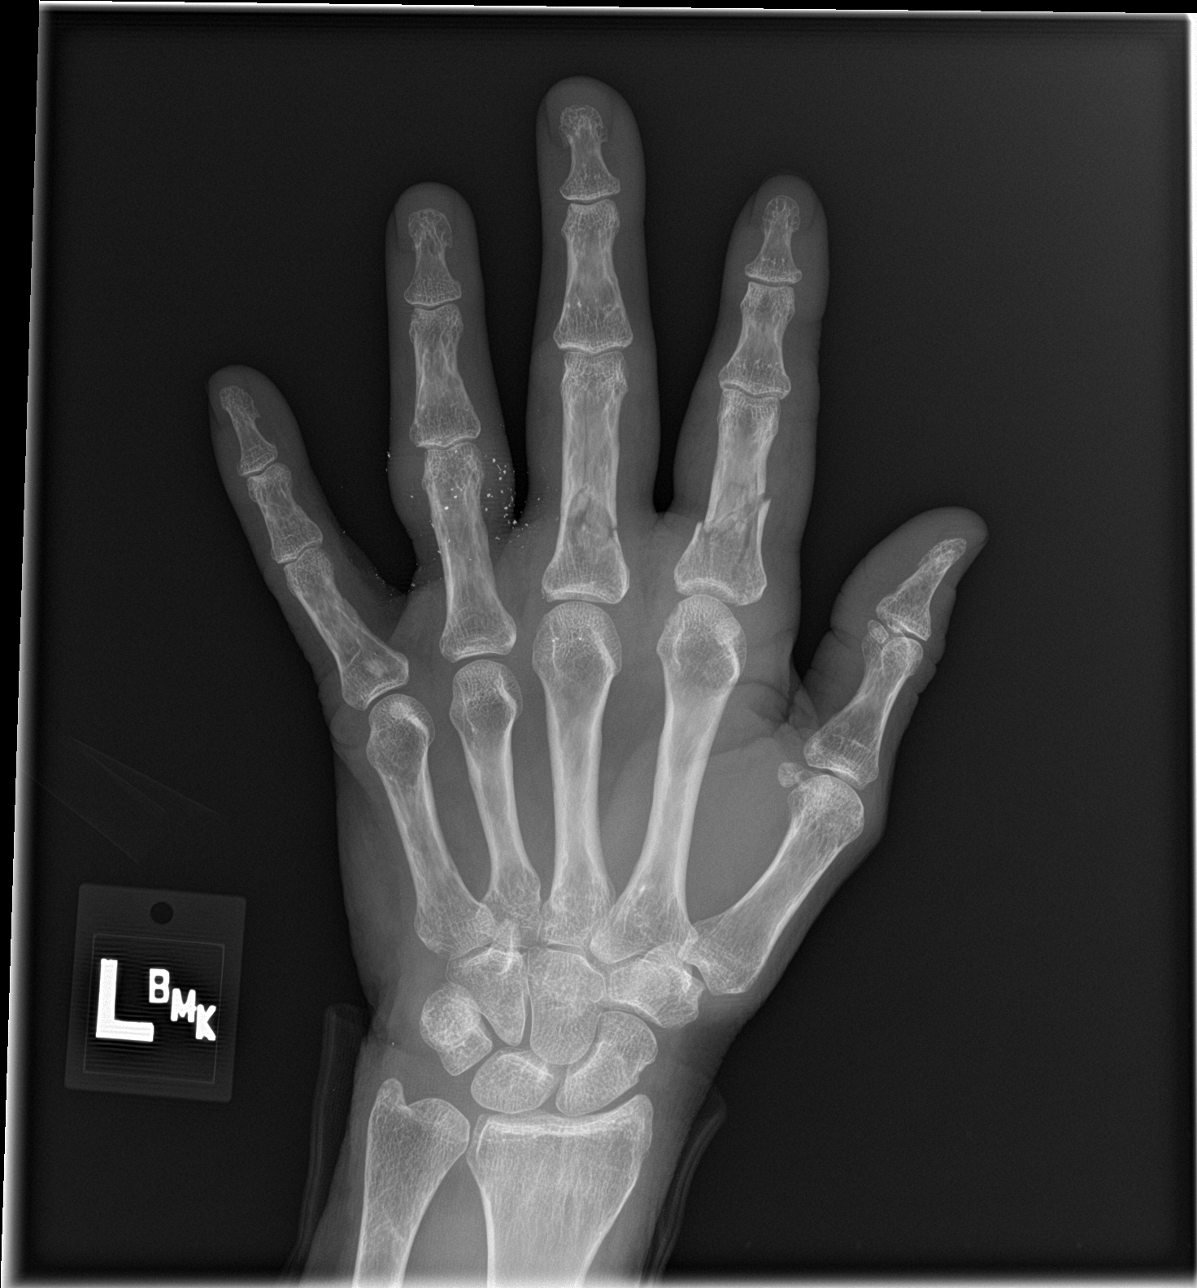

[hand lat]
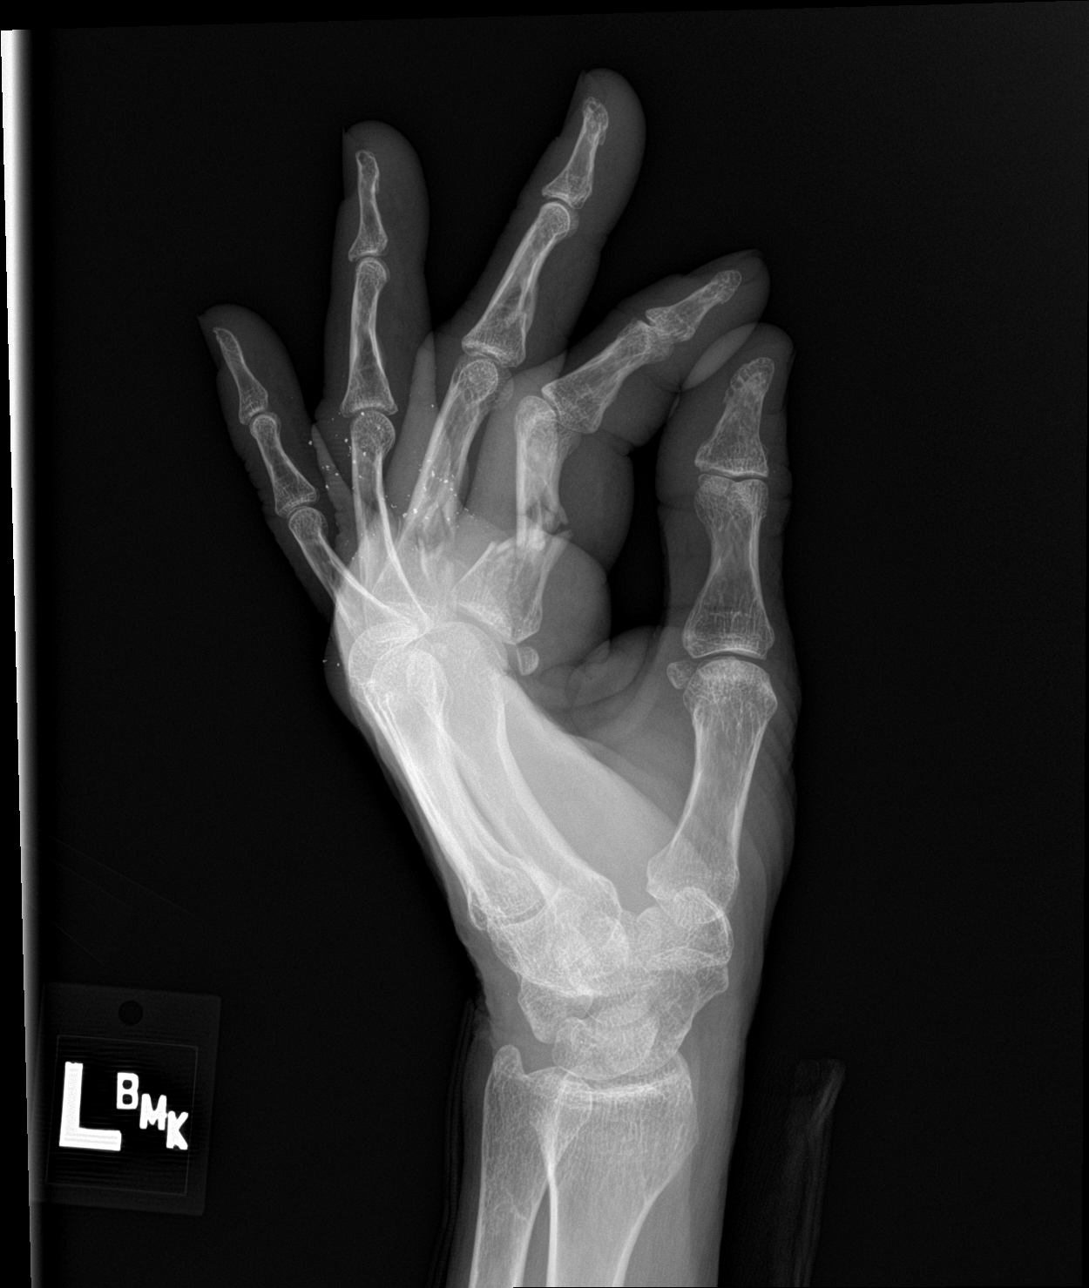

[hand obl (2 of 2)]
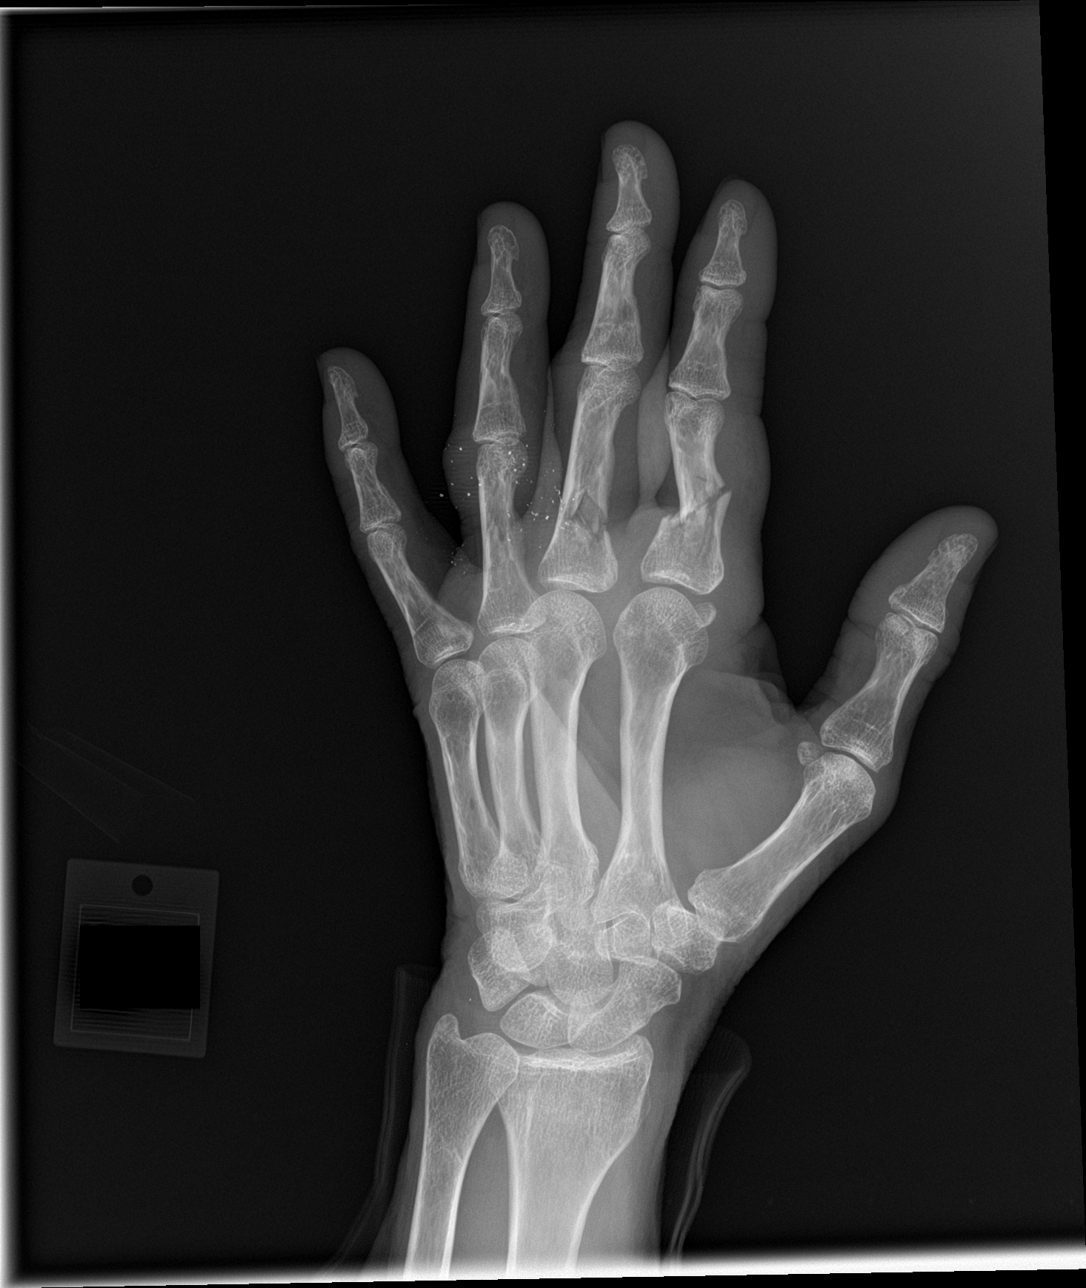

[4 of 4 positions shown; findings below may reference images not displayed]

FINDINGS: Comminuted fractures of the second and third proximal phalanges of
the left hand. There is dorsal angulation of the second proximal
phalanx fracture. There are metallic fragments overlying the fourth
proximal phalanx, which are likely fragments of the ring that was
cut off following the acquisition of the initial image. There is
general moderate soft tissue swelling of the proximal fingers and at
the metacarpophalangeal joints.
IMPRESSION: Comminuted fractures of the proximal phalanges of the left second
and third fingers with associated moderate soft tissue swelling.

## 2019-07-04 DIAGNOSIS — D485 Neoplasm of uncertain behavior of skin: Secondary | ICD-10-CM | POA: Diagnosis not present

## 2019-07-04 DIAGNOSIS — L723 Sebaceous cyst: Secondary | ICD-10-CM | POA: Diagnosis not present

## 2019-12-08 ENCOUNTER — Other Ambulatory Visit: Payer: Self-pay | Admitting: Primary Care

## 2019-12-08 DIAGNOSIS — N289 Disorder of kidney and ureter, unspecified: Secondary | ICD-10-CM

## 2019-12-08 DIAGNOSIS — D509 Iron deficiency anemia, unspecified: Secondary | ICD-10-CM

## 2019-12-12 ENCOUNTER — Other Ambulatory Visit: Payer: Self-pay

## 2019-12-12 ENCOUNTER — Other Ambulatory Visit (INDEPENDENT_AMBULATORY_CARE_PROVIDER_SITE_OTHER): Payer: BLUE CROSS/BLUE SHIELD

## 2019-12-12 DIAGNOSIS — D509 Iron deficiency anemia, unspecified: Secondary | ICD-10-CM | POA: Diagnosis not present

## 2019-12-12 DIAGNOSIS — N289 Disorder of kidney and ureter, unspecified: Secondary | ICD-10-CM

## 2019-12-12 LAB — COMPREHENSIVE METABOLIC PANEL
ALT: 26 U/L (ref 0–35)
AST: 23 U/L (ref 0–37)
Albumin: 4.3 g/dL (ref 3.5–5.2)
Alkaline Phosphatase: 73 U/L (ref 39–117)
BUN: 13 mg/dL (ref 6–23)
CO2: 31 mEq/L (ref 19–32)
Calcium: 9.9 mg/dL (ref 8.4–10.5)
Chloride: 104 mEq/L (ref 96–112)
Creatinine, Ser: 0.91 mg/dL (ref 0.40–1.20)
GFR: 62.28 mL/min (ref >60.00)
Glucose, Bld: 99 mg/dL (ref 70–99)
Potassium: 4.1 mEq/L (ref 3.5–5.1)
Sodium: 140 mEq/L (ref 135–145)
Total Bilirubin: 0.5 mg/dL (ref 0.2–1.2)
Total Protein: 6.8 g/dL (ref 6.0–8.3)

## 2019-12-12 LAB — CBC
HCT: 37.3 % (ref 36.0–46.0)
Hemoglobin: 12.6 g/dL (ref 12.0–15.0)
MCHC: 33.9 g/dL (ref 30.0–36.0)
MCV: 93.8 fl (ref 78.0–100.0)
Platelets: 240 10*3/uL (ref 150.0–400.0)
RBC: 3.98 Mil/uL (ref 3.87–5.11)
RDW: 12.9 % (ref 11.5–15.5)
WBC: 4.5 10*3/uL (ref 4.0–10.5)

## 2019-12-12 LAB — LIPID PANEL
Cholesterol: 216 mg/dL — ABNORMAL HIGH (ref 0–200)
HDL: 67.2 mg/dL (ref >39.00)
LDL Cholesterol: 130 mg/dL — ABNORMAL HIGH (ref 0–99)
NonHDL: 148.86
Total CHOL/HDL Ratio: 3
Triglycerides: 94 mg/dL (ref 0.0–149.0)
VLDL: 18.8 mg/dL (ref 0.0–40.0)

## 2019-12-13 ENCOUNTER — Other Ambulatory Visit: Payer: BLUE CROSS/BLUE SHIELD

## 2019-12-17 ENCOUNTER — Other Ambulatory Visit: Payer: Self-pay

## 2019-12-17 ENCOUNTER — Encounter: Payer: Self-pay | Admitting: Primary Care

## 2019-12-17 ENCOUNTER — Ambulatory Visit (INDEPENDENT_AMBULATORY_CARE_PROVIDER_SITE_OTHER): Payer: BLUE CROSS/BLUE SHIELD | Admitting: Primary Care

## 2019-12-17 VITALS — BP 106/70 | HR 65 | Temp 96.9°F | Ht 61.0 in | Wt 106.5 lb

## 2019-12-17 DIAGNOSIS — E785 Hyperlipidemia, unspecified: Secondary | ICD-10-CM | POA: Diagnosis not present

## 2019-12-17 DIAGNOSIS — Z1231 Encounter for screening mammogram for malignant neoplasm of breast: Secondary | ICD-10-CM

## 2019-12-17 DIAGNOSIS — D509 Iron deficiency anemia, unspecified: Secondary | ICD-10-CM

## 2019-12-17 DIAGNOSIS — N898 Other specified noninflammatory disorders of vagina: Secondary | ICD-10-CM

## 2019-12-17 DIAGNOSIS — Z0000 Encounter for general adult medical examination without abnormal findings: Secondary | ICD-10-CM | POA: Diagnosis not present

## 2019-12-17 NOTE — Assessment & Plan Note (Signed)
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  Discussed options for treatment including OTC vaginal moisturizers vs Estrace cream.  She will try OTC products first and update me if no improvement.

## 2019-12-17 NOTE — Assessment & Plan Note (Signed)
Immunizations UTD, she will think about Shingrix vaccines. Pap smear UTD. Mammogram due, pending. Colonoscopy UTD, due in 2029. Encouraged a healthy diet, regular exercise. Exam today benign. Labs reviewed.

## 2019-12-17 NOTE — Assessment & Plan Note (Signed)
LDL of 130 on recent labs, HDL in the 60's. Encouraged a healthy diet, regular exercise. Repeat in 1 year.  The 10-year ASCVD risk score Mikey Bussing DC Brooke Bonito., et al., 2013) is: 3%   Values used to calculate the score:     Age: 64 years     Sex: Female     Is Non-Hispanic African American: No     Diabetic: No     Tobacco smoker: No     Systolic Blood Pressure: A999333 mmHg     Is BP treated: No     HDL Cholesterol: 67.2 mg/dL     Total Cholesterol: 216 mg/dL

## 2019-12-17 NOTE — Assessment & Plan Note (Signed)
Recent CBC unremarkable.  °

## 2019-12-17 NOTE — Patient Instructions (Signed)
Call the breast center to schedule your mammogram.   Start exercising. You should be getting 150 minutes of moderate intensity exercise weekly.  Continue to work on a healthy diet. Ensure you are consuming 64 ounces of water daily.  Try vaginal moisturizers as discussed. Update me!  It was a pleasure to see you today!

## 2019-12-17 NOTE — Progress Notes (Signed)
Subjective:    Patient ID: Denise Roy, female    DOB: [birth year], 64 y.o.   MRN: [id number]  HPI  This visit occurred during the SARS-CoV-2 public health emergency.  Safety protocols were in place, including screening questions prior to the visit, additional usage of staff PPE, and extensive cleaning of exam room while observing appropriate contact time as indicated for disinfecting solutions.   Denise Roy is a 64 year old female who presents today for complete physical.  She would also like to mention chronic external vaginal itching. Chronic for years, has been using topical hydrocortisone cream intermittently. She denies a known history of lichen sclerosis. She is post menopausal.   Also with intermittent rashes to lower extremities that are itchy in nature, mild erythema without scaling. She'll apply OTC hydrocortisone cream with improvement.   Immunizations: -Tetanus: Completed in 2019 -Influenza: Completed this season  -Shingles: Completed Zostavax in 2017  Diet: She endorses a fair diet. Exercise: She is not exercising.  Eye exam: Completed in 2019 Dental exam: Completes semi-annually   Pap Smear: Completed in 2020, no HPV detected, no malignancy. Mammogram: Completed in 2020 Colonoscopy: Completed in 2019 Hep C Screen: Negative  BP Readings from Last 3 Encounters:  12/17/19 106/70  12/12/18 104/64  12/18/17 131/68   The 10-year ASCVD risk score Mikey Bussing DC Jr., et al., 2013) is: 3%   Values used to calculate the score:     Age: 45 years     Sex: Female     Is Non-Hispanic African American: No     Diabetic: No     Tobacco smoker: No     Systolic Blood Pressure: A999333 mmHg     Is BP treated: No     HDL Cholesterol: 67.2 mg/dL     Total Cholesterol: 216 mg/dL    Review of Systems  Constitutional: Negative for unexpected weight change.  HENT: Negative for rhinorrhea.   Respiratory: Negative for cough and shortness of breath.   Cardiovascular: Negative for  chest pain.  Gastrointestinal: Negative for constipation and diarrhea.  Genitourinary: Negative for difficulty urinating.  Musculoskeletal: Negative for arthralgias and myalgias.  Skin: Negative for rash.  Allergic/Immunologic: Negative for environmental allergies.  Neurological: Negative for dizziness, numbness and headaches.  Psychiatric/Behavioral: The patient is not nervous/anxious.        No past medical history on file.   Social History   Socioeconomic History  . Marital status: Married    Spouse name: Not on file  . Number of children: Not on file  . Years of education: Not on file  . Highest education level: Not on file  Occupational History  . Not on file  Tobacco Use  . Smoking status: Never Smoker  . Smokeless tobacco: Never Used  Substance and Sexual Activity  . Alcohol use: No  . Drug use: No  . Sexual activity: Never    Birth control/protection: Post-menopausal  Other Topics Concern  . Not on file  Social History Narrative  . Not on file   Social Determinants of Health   Financial Resource Strain:   . Difficulty of Paying Living Expenses: Not on file  Food Insecurity:   . Worried About Charity fundraiser in the Last Year: Not on file  . Ran Out of Food in the Last Year: Not on file  Transportation Needs:   . Lack of Transportation (Medical): Not on file  . Lack of Transportation (Non-Medical): Not on file  Physical Activity:   .  Days of Exercise per Week: Not on file  . Minutes of Exercise per Session: Not on file  Stress:   . Feeling of Stress : Not on file  Social Connections:   . Frequency of Communication with Friends and Family: Not on file  . Frequency of Social Gatherings with Friends and Family: Not on file  . Attends Religious Services: Not on file  . Active Member of Clubs or Organizations: Not on file  . Attends Archivist Meetings: Not on file  . Marital Status: Not on file  Intimate Partner Violence:   . Fear of Current  or Ex-Partner: Not on file  . Emotionally Abused: Not on file  . Physically Abused: Not on file  . Sexually Abused: Not on file    No past surgical history on file.  Family History  Problem Relation Age of Onset  . Hypertension Mother   . Cancer Father     Allergies  Allergen Reactions  . Erythromycin     Stomach upset    Current Outpatient Medications on File Prior to Visit  Medication Sig Dispense Refill  . BIOTIN PO Take 500 mg's by mouth daily    . Multiple Vitamin (MULTIVITAMIN) tablet Take 1 tablet by mouth daily.     No current facility-administered medications on file prior to visit.    BP 106/70   Pulse 65   Temp (!) 96.9 F (36.1 C) (Temporal)   Ht 5\' 1"  (1.549 m)   Wt 106 lb 8 oz (48.3 kg)   SpO2 98%   BMI 20.12 kg/m    Objective:   Physical Exam  Constitutional: She is oriented to person, place, and time. She appears well-nourished.  HENT:  Right Ear: Tympanic membrane and ear canal normal.  Left Ear: Tympanic membrane and ear canal normal.  Mouth/Throat: Oropharynx is clear and moist.  Eyes: Pupils are equal, round, and reactive to light. EOM are normal.  Cardiovascular: Normal rate and regular rhythm.  Respiratory: Effort normal and breath sounds normal.  GI: Soft. Bowel sounds are normal. There is no abdominal tenderness.  Musculoskeletal:        General: Normal range of motion.     Cervical back: Neck supple.  Neurological: She is alert and oriented to person, place, and time. No cranial nerve deficit.  Reflex Scores:      Patellar reflexes are 2+ on the right side and 2+ on the left side. Skin: Skin is warm and dry.  Psychiatric: She has a normal mood and affect.           Assessment & Plan:

## 2019-12-19 DIAGNOSIS — Z1231 Encounter for screening mammogram for malignant neoplasm of breast: Secondary | ICD-10-CM | POA: Diagnosis not present

## 2019-12-19 DIAGNOSIS — Z1239 Encounter for other screening for malignant neoplasm of breast: Secondary | ICD-10-CM | POA: Diagnosis not present

## 2019-12-19 LAB — HM MAMMOGRAPHY

## 2019-12-23 ENCOUNTER — Encounter: Payer: Self-pay | Admitting: Primary Care

## 2020-10-14 ENCOUNTER — Telehealth: Payer: Self-pay

## 2020-10-14 NOTE — Telephone Encounter (Signed)
[facility] - Client TELEPHONE ADVICE RECORD [facility] Patient Name: Denise Roy Gender: Female DOB: [birth year] Age: 64 Y 52 M 23 D Return Phone Number: [phone] (Primary), [phone] (Secondary) Address: City/State/Zip: [place] [address] Client [facility] - Client Client Site [place] - Day Physician Alma Friendly - NP Contact Type Call Who Is Calling Patient / Member / Family / Caregiver Call Type Triage / Clinical Relationship To Patient Self Return Phone Number [phone] (Primary) Chief Complaint CHEST PAIN (>=21 years) - pain, pressure, heaviness or tightness Reason for Call Symptomatic / Request for [place] has pain for a few months that comes and goes throughright breast, back and ribs and it wakes her up. She has appt MON. Translation No Nurse Assessment Nurse: Hassell Done, RN, Melanie Date/Time (Eastern Time): 10/14/2020 3:36:44 PM Confirm and document reason for call. If symptomatic, describe symptoms. ---Caller states she has pain in her breast that goes into her back and ribs- might be her rib cage. Not sharp, rates it a 4. Does the patient have any new or worsening symptoms? ---Yes Will a triage be completed? ---Yes Related visit to physician within the last 2 weeks? ---No Does the PT have any chronic conditions? (i.e. diabetes, asthma, this includes High risk factors for pregnancy, etc.) ---No Is this a behavioral health or substance abuse call? ---No Guidelines Guideline Title Affirmed Question Affirmed Notes Nurse Date/Time Eilene Ghazi Time) Breast Symptoms [1] Breast pain AND [2] cause is not known Hassell Done, RN, Threasa Beards 10/14/2020 3:38:22 PM Disp. Time Eilene Ghazi Time) Disposition Final User 10/14/2020 3:35:05 PM Send to Urgent Vassie Loll 10/14/2020 3:41:23 PM See PCP within 2 Weeks Yes Hassell Done, RN, Threasa Beards Caller Disagree/Comply  Comply Caller Understands Yes PreDisposition Call Doctor PLEASE NOTE: All timestamps contained within this report are represented as [place] Standard Time. CONFIDENTIALTY NOTICE: This fax transmission is intended only for the addressee. It contains information that is legally privileged, confidential or otherwise protected from use or disclosure. If you are not the intended recipient, you are strictly prohibited from reviewing, disclosing, copying using or disseminating any of this information or taking any action in reliance on or regarding this information. If you have received this fax in error, please notify us immediately by telephone so that we can arrange for its return to [place]. Phone: [phone], Toll-Free: [phone], Fax: [phone] Page: 2 of 2 Call Id: [id number] Care Advice Given Per Guideline SEE PCP WITHIN 2 WEEKS: * You need to be seen for this ongoing problem within the next 2 weeks. * PCP VISIT: Call your doctor (or NP/PA) during regular office hours and make an appointment. WEAR A GOOD BRA: * Wearing a good quality supportive bra is important. CALL BACK IF: * Fever or redness occurs * You become worse CARE ADVICE given per Breast Symptoms (Adult) guideline. Referrals REFERRED TO PCP OFFICE

## 2020-10-14 NOTE — Telephone Encounter (Signed)
Pt already has appt with Gentry Fitz NP on 10/19/20 at 2:20. Sending note to Gentry Fitz NP.

## 2020-10-15 NOTE — Telephone Encounter (Signed)
Noted, will evaluate as schedule.

## 2020-10-19 ENCOUNTER — Other Ambulatory Visit: Payer: Self-pay

## 2020-10-19 ENCOUNTER — Encounter: Payer: Self-pay | Admitting: Primary Care

## 2020-10-19 ENCOUNTER — Ambulatory Visit: Payer: BLUE CROSS/BLUE SHIELD | Admitting: Primary Care

## 2020-10-19 VITALS — BP 110/76 | HR 77 | Temp 98.6°F | Ht 61.0 in | Wt 112.0 lb

## 2020-10-19 DIAGNOSIS — N644 Mastodynia: Secondary | ICD-10-CM | POA: Diagnosis not present

## 2020-10-19 NOTE — Assessment & Plan Note (Signed)
Unclear etiology, may not even be originating from the breast.  Exam today benign.  No evidence of shingles, muscular spasm, breast abnormality.  Given that her pain is mostly felt to the breast we will obtain diagnostic mammogram with ultrasounds.  Orders placed.  Consider thoracic back x-ray versus cervical spine x-ray if mammogram is negative  Also discussed importance of proper posture while at work.

## 2020-10-19 NOTE — Progress Notes (Signed)
Subjective:    Patient ID: Denise Roy, female    DOB: [birth year], 64 y.o.   MRN: [id number]  HPI  This visit occurred during the SARS-CoV-2 public health emergency.  Safety protocols were in place, including screening questions prior to the visit, additional usage of staff PPE, and extensive cleaning of exam room while observing appropriate contact time as indicated for disinfecting solutions.   Denise Roy is a 64 year old female who presents today with a chief complaint of pain.  Her pain is felt to the right breast, feels like a "shooting pain through the right breast, intermittent, overall not severe. She's not exactly sure where her pain is originating, can sometimes notice tenderness to the bottom of her right anterior rib cage if she leans forward.    Symptoms originally began about 2 months ago, are intermittent that will occur numerous times daily or some days not at all.   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  She does have intermittent neck and thoracic back pain, but she does not recall any unusual activity outside her typical day today routine.  She denies skin changes, new lumps or changes to the breast, rash, cough, acute neck or thoracic back pain. She changed her bra thinking that this could be contributing, has not noticed improvement.  BP Readings from Last 3 Encounters:  10/19/20 110/76  12/17/19 106/70  12/12/18 104/64     Review of Systems  Respiratory: Negative for shortness of breath and wheezing.   Musculoskeletal: Positive for arthralgias.  Skin: Negative for color change and rash.       History reviewed. No pertinent past medical history.   Social History   Socioeconomic History  . Marital status: Married    Spouse name: Not on file  . Number of children: Not on file  . Years of  education: Not on file  . Highest education level: Not on file  Occupational History  . Not on file  Tobacco Use  . Smoking status: Never Smoker  . Smokeless tobacco: Never Used  Substance and Sexual Activity  . Alcohol use: No  . Drug use: No  . Sexual activity: Never    Birth control/protection: Post-menopausal  Other Topics Concern  . Not on file  Social History Narrative  . Not on file   Social Determinants of Health   Financial Resource Strain: Not on file  Food Insecurity: Not on file  Transportation Needs: Not on file  Physical Activity: Not on file  Stress: Not on file  Social Connections: Not on file  Intimate Partner Violence: Not on file    History reviewed. No pertinent surgical history.  Family History  Problem Relation Age of Onset  . Hypertension Mother   . Cancer Father     Allergies  Allergen Reactions  . Erythromycin     Stomach upset    Current Outpatient Medications on File Prior to Visit  Medication Sig Dispense Refill  . BIOTIN PO Take 500 mg's by mouth daily    . Multiple Vitamin (MULTIVITAMIN) tablet Take 1 tablet by mouth daily.     No current facility-administered medications on file prior to visit.    BP 110/76   Pulse 77   Temp 98.6 F (37 C) (Temporal)   Ht 5\' 1"  (1.549 m)   Wt  112 lb (50.8 kg)   SpO2 97%   BMI 21.16 kg/m    Objective:   Physical Exam Constitutional:      Appearance: She is well-nourished.  Neck:      Comments: Chronic right lateral neck pain with spread to right scapular region. Pulmonary:     Effort: Pulmonary effort is normal.     Breath sounds: Normal breath sounds.  Chest:     Chest wall: No mass, swelling or edema.  Breasts:     Right: No mass, skin change, tenderness or axillary adenopathy.     Left: No mass, skin change, tenderness or axillary adenopathy.      Comments: Densities noted to bilateral breasts. Nothing noted underneath right breast to right anterior rib  cage Musculoskeletal:     Cervical back: Full passive range of motion without pain and neck supple. No pain with movement, spinous process tenderness or muscular tenderness.  Lymphadenopathy:     Upper Body:     Right upper body: No axillary adenopathy.     Left upper body: No axillary adenopathy.  Skin:    General: Skin is warm and dry.  Neurological:     Mental Status: She is alert.  Psychiatric:        Mood and Affect: Mood and affect normal.            Assessment & Plan:

## 2020-10-19 NOTE — Patient Instructions (Signed)
You will be contacted regarding your diagnostic mammogram and ultrasound.  Please let us know if you have not been contacted within a few days.  It was a pleasure to see you today!

## 2020-12-03 ENCOUNTER — Telehealth: Payer: Self-pay

## 2020-12-03 NOTE — Telephone Encounter (Signed)
That's completely fine for her to have her labs drawn ahead of time, not a problem at all.

## 2020-12-03 NOTE — Telephone Encounter (Signed)
Patient called to reschedule her CPE with Anda Kraft from 12/17/20 due to provider been out of the office. Rescheduled to 2/16. Patient wanted to come to her scheduled lab appointment on 2/9 so she can discuss her lab results when she sees Nelagoney. I advised patient of the labs been done same day now but patient felt like it would not be as useful because then she can not discuss questions with provider. I did not cancel lab appointment and told patient I will send message to the provider to make sure this is ok and the nurse would call her back to let her know.

## 2020-12-04 NOTE — Telephone Encounter (Signed)
Patient advised.

## 2020-12-07 DIAGNOSIS — H5213 Myopia, bilateral: Secondary | ICD-10-CM | POA: Diagnosis not present

## 2020-12-10 ENCOUNTER — Other Ambulatory Visit: Payer: Self-pay | Admitting: Primary Care

## 2020-12-10 DIAGNOSIS — D509 Iron deficiency anemia, unspecified: Secondary | ICD-10-CM

## 2020-12-10 DIAGNOSIS — E785 Hyperlipidemia, unspecified: Secondary | ICD-10-CM

## 2020-12-16 ENCOUNTER — Other Ambulatory Visit: Payer: Self-pay

## 2020-12-16 ENCOUNTER — Other Ambulatory Visit (INDEPENDENT_AMBULATORY_CARE_PROVIDER_SITE_OTHER): Payer: BLUE CROSS/BLUE SHIELD

## 2020-12-16 DIAGNOSIS — D509 Iron deficiency anemia, unspecified: Secondary | ICD-10-CM | POA: Diagnosis not present

## 2020-12-16 DIAGNOSIS — E785 Hyperlipidemia, unspecified: Secondary | ICD-10-CM | POA: Diagnosis not present

## 2020-12-16 LAB — CBC
HCT: 35.7 % — ABNORMAL LOW (ref 36.0–46.0)
Hemoglobin: 12.3 g/dL (ref 12.0–15.0)
MCHC: 34.3 g/dL (ref 30.0–36.0)
MCV: 93 fl (ref 78.0–100.0)
Platelets: 241 10*3/uL (ref 150.0–400.0)
RBC: 3.84 Mil/uL — ABNORMAL LOW (ref 3.87–5.11)
RDW: 12.7 % (ref 11.5–15.5)
WBC: 4.4 10*3/uL (ref 4.0–10.5)

## 2020-12-16 LAB — LIPID PANEL
Cholesterol: 221 mg/dL — ABNORMAL HIGH (ref 0–200)
HDL: 65.4 mg/dL (ref >39.00)
LDL Cholesterol: 134 mg/dL — ABNORMAL HIGH (ref 0–99)
NonHDL: 155.51
Total CHOL/HDL Ratio: 3
Triglycerides: 108 mg/dL (ref 0.0–149.0)
VLDL: 21.6 mg/dL (ref 0.0–40.0)

## 2020-12-16 LAB — IBC + FERRITIN
Ferritin: 41.5 ng/mL (ref 10.0–291.0)
Iron: 76 ug/dL (ref 42–145)
Saturation Ratios: 27.4 % (ref 20.0–50.0)
Transferrin: 198 mg/dL — ABNORMAL LOW (ref 212.0–360.0)

## 2020-12-16 LAB — COMPREHENSIVE METABOLIC PANEL
ALT: 23 U/L (ref 0–35)
AST: 22 U/L (ref 0–37)
Albumin: 4.2 g/dL (ref 3.5–5.2)
Alkaline Phosphatase: 71 U/L (ref 39–117)
BUN: 13 mg/dL (ref 6–23)
CO2: 32 mEq/L (ref 19–32)
Calcium: 9.7 mg/dL (ref 8.4–10.5)
Chloride: 104 mEq/L (ref 96–112)
Creatinine, Ser: 0.95 mg/dL (ref 0.40–1.20)
GFR: 63.18 mL/min (ref >60.00)
Glucose, Bld: 94 mg/dL (ref 70–99)
Potassium: 4.9 mEq/L (ref 3.5–5.1)
Sodium: 140 mEq/L (ref 135–145)
Total Bilirubin: 0.6 mg/dL (ref 0.2–1.2)
Total Protein: 6.3 g/dL (ref 6.0–8.3)

## 2020-12-17 ENCOUNTER — Encounter: Payer: BLUE CROSS/BLUE SHIELD | Admitting: Primary Care

## 2020-12-18 ENCOUNTER — Encounter: Payer: BLUE CROSS/BLUE SHIELD | Admitting: Primary Care

## 2020-12-21 DIAGNOSIS — N644 Mastodynia: Secondary | ICD-10-CM | POA: Diagnosis not present

## 2020-12-21 DIAGNOSIS — Z1231 Encounter for screening mammogram for malignant neoplasm of breast: Secondary | ICD-10-CM | POA: Diagnosis not present

## 2020-12-21 LAB — HM MAMMOGRAPHY

## 2020-12-23 ENCOUNTER — Encounter: Payer: Self-pay | Admitting: Primary Care

## 2020-12-23 ENCOUNTER — Other Ambulatory Visit: Payer: Self-pay

## 2020-12-23 ENCOUNTER — Ambulatory Visit (INDEPENDENT_AMBULATORY_CARE_PROVIDER_SITE_OTHER): Payer: BLUE CROSS/BLUE SHIELD | Admitting: Primary Care

## 2020-12-23 VITALS — BP 108/76 | HR 68 | Temp 98.0°F | Ht 61.0 in | Wt 110.0 lb

## 2020-12-23 DIAGNOSIS — E785 Hyperlipidemia, unspecified: Secondary | ICD-10-CM | POA: Diagnosis not present

## 2020-12-23 DIAGNOSIS — N898 Other specified noninflammatory disorders of vagina: Secondary | ICD-10-CM

## 2020-12-23 DIAGNOSIS — Z0000 Encounter for general adult medical examination without abnormal findings: Secondary | ICD-10-CM | POA: Diagnosis not present

## 2020-12-23 DIAGNOSIS — D509 Iron deficiency anemia, unspecified: Secondary | ICD-10-CM

## 2020-12-23 DIAGNOSIS — N941 Unspecified dyspareunia: Secondary | ICD-10-CM | POA: Diagnosis not present

## 2020-12-23 MED ORDER — ESTRADIOL 0.1 MG/GM VA CREA: 1.0000 | TOPICAL_CREAM | VAGINAL | 0 refills | Status: DC

## 2020-12-23 NOTE — Progress Notes (Signed)
Subjective:    Patient ID: Denise Roy, female    DOB: 02-Aug-1956, 65 y.o.   MRN: [id number]  HPI  This visit occurred during the SARS-CoV-2 public health emergency.  Safety protocols were in place, including screening questions prior to the visit, additional usage of staff PPE, and extensive cleaning of exam room while observing appropriate contact time as indicated for disinfecting solutions.   Denise Roy is a 65 year old female who presents today for complete physical.  She would also like to discuss chronic vaginal irritation with itching and raw sensation over the last year, worse over the last 2-3 months ago. She's tried OTC cortisone cream without improvement. Symptoms are mostly bothersome at night after her shower and during the night.  She also experiences painful intercourse at times.  She mention this during her visit last year, but symptoms were not as bothersome.  Immunizations: -Tetanus: 2019 -Influenza: Declines  -Shingles: Completed Zostavax 2017 -Covid-19: Completed 3 doses  Diet: She endorses a fair diet.  Exercise: She is active, no regular exercise   Eye exam: Completed in 2022 Dental exam: Completes semi-annually   Pap Smear: Completed in 2020 Mammogram: Scheduled for next week Colonoscopy: Completed in 2019 Hep C Screen: Negative  BP Readings from Last 3 Encounters:  12/23/20 108/76  10/19/20 110/76  12/17/19 106/70     Review of Systems  Constitutional: Negative for unexpected weight change.  HENT: Negative for rhinorrhea.   Eyes: Negative for visual disturbance.  Respiratory: Negative for cough and shortness of breath.   Cardiovascular: Negative for chest pain.  Gastrointestinal: Negative for constipation and diarrhea.  Genitourinary: Negative for difficulty urinating.  Musculoskeletal: Positive for arthralgias.  Skin: Negative for rash.  Allergic/Immunologic: Negative for environmental allergies.  Neurological: Negative for dizziness and  headaches.  Psychiatric/Behavioral: The patient is not nervous/anxious.        History reviewed. No pertinent past medical history.   Social History   Socioeconomic History  . Marital status: Married    Spouse name: Not on file  . Number of children: Not on file  . Years of education: Not on file  . Highest education level: Not on file  Occupational History  . Not on file  Tobacco Use  . Smoking status: Never Smoker  . Smokeless tobacco: Never Used  Substance and Sexual Activity  . Alcohol use: No  . Drug use: No  . Sexual activity: Never    Birth control/protection: Post-menopausal  Other Topics Concern  . Not on file  Social History Narrative  . Not on file   Social Determinants of Health   Financial Resource Strain: Not on file  Food Insecurity: Not on file  Transportation Needs: Not on file  Physical Activity: Not on file  Stress: Not on file  Social Connections: Not on file  Intimate Partner Violence: Not on file    History reviewed. No pertinent surgical history.  Family History  Problem Relation Age of Onset  . Hypertension Mother   . Cancer Father     Allergies  Allergen Reactions  . Erythromycin     Stomach upset    Current Outpatient Medications on File Prior to Visit  Medication Sig Dispense Refill  . BIOTIN PO Take 500 mg's by mouth daily    . Multiple Vitamin (MULTIVITAMIN) tablet Take 1 tablet by mouth daily.     No current facility-administered medications on file prior to visit.    BP 108/76   Pulse 68  Temp 98 F (36.7 C) (Temporal)   Ht 5\' 1"  (1.549 m)   Wt 110 lb (49.9 kg)   SpO2 98%   BMI 20.78 kg/m    Objective:   Physical Exam Constitutional:      Appearance: She is well-nourished.  HENT:     Right Ear: Tympanic membrane and ear canal normal.     Left Ear: Tympanic membrane and ear canal normal.     Mouth/Throat:     Mouth: Oropharynx is clear and moist.  Eyes:     Extraocular Movements: EOM normal.      Pupils: Pupils are equal, round, and reactive to light.  Cardiovascular:     Rate and Rhythm: Normal rate and regular rhythm.  Pulmonary:     Effort: Pulmonary effort is normal.     Breath sounds: Normal breath sounds.  Abdominal:     General: Bowel sounds are normal.     Palpations: Abdomen is soft.     Tenderness: There is no abdominal tenderness.  Musculoskeletal:        General: Normal range of motion.     Cervical back: Neck supple.  Skin:    General: Skin is warm and dry.  Neurological:     Mental Status: She is alert and oriented to person, place, and time.     Cranial Nerves: No cranial nerve deficit.     Deep Tendon Reflexes:     Reflex Scores:      Patellar reflexes are 2+ on the right side and 2+ on the left side. Psychiatric:        Mood and Affect: Mood and affect and mood normal.            Assessment & Plan:

## 2020-12-23 NOTE — Assessment & Plan Note (Signed)
Chronic, ongoing and more severe over the last several months.  Suspect vaginal atrophy secondary to menopause.  Discussed options including vaginal moisturizers, topical estrogen cream.  Patient prefers to proceed with estrogen cream.  Prescription for Estrace sent to pharmacy, she will update.

## 2020-12-23 NOTE — Assessment & Plan Note (Addendum)
Slightly above goal, about the same as last year. Encouraged regular exercise and a healthy diet. Continue to monitor.

## 2020-12-23 NOTE — Assessment & Plan Note (Signed)
Overall stable, patient just finished a diet that was plant-based.  Continue to monitor.

## 2020-12-23 NOTE — Patient Instructions (Signed)
Start exercising. You should be getting 150 minutes of moderate intensity exercise weekly.  Apply the Estrace cream 2-3 times weekly for dryness, itching, painful intercourse.  Continue to work on a healthy diet. Ensure you are consuming 64 ounces of water daily.  It was a pleasure to see you today!   Preventive Care 53-65 Years Old, Female Preventive care refers to lifestyle choices and visits with your health care provider that can promote health and wellness. This includes:  A yearly physical exam. This is also called an annual wellness visit.  Regular dental and eye exams.  Immunizations.  Screening for certain conditions.  Healthy lifestyle choices, such as: ? Eating a healthy diet. ? Getting regular exercise. ? Not using drugs or products that contain nicotine and tobacco. ? Limiting alcohol use. What can I expect for my preventive care visit? Physical exam Your health care provider will check your:  Height and weight. These may be used to calculate your BMI (body mass index). BMI is a measurement that tells if you are at a healthy weight.  Heart rate and blood pressure.  Body temperature.  Skin for abnormal spots. Counseling Your health care provider may ask you questions about your:  Past medical problems.  Family's medical history.  Alcohol, tobacco, and drug use.  Emotional well-being.  Home life and relationship well-being.  Sexual activity.  Diet, exercise, and sleep habits.  Work and work Statistician.  Access to firearms.  Method of birth control.  Menstrual cycle.  Pregnancy history. What immunizations do I need? Vaccines are usually given at various ages, according to a schedule. Your health care provider will recommend vaccines for you based on your age, medical history, and lifestyle or other factors, such as travel or where you work.   What tests do I need? Blood tests  Lipid and cholesterol levels. These may be checked every 5  years, or more often if you are over 23 years old.  Hepatitis C test.  Hepatitis B test. Screening  Lung cancer screening. You may have this screening every year starting at age 66 if you have a 30-pack-year history of smoking and currently smoke or have quit within the past 15 years.  Colorectal cancer screening. ? All adults should have this screening starting at age 41 and continuing until age 42. ? Your health care provider may recommend screening at age 65 if you are at increased risk. ? You will have tests every 1-10 years, depending on your results and the type of screening test.  Diabetes screening. ? This is done by checking your blood sugar (glucose) after you have not eaten for a while (fasting). ? You may have this done every 1-3 years.  Mammogram. ? This may be done every 1-2 years. ? Talk with your health care provider about when you should start having regular mammograms. This may depend on whether you have a family history of breast cancer.  BRCA-related cancer screening. This may be done if you have a family history of breast, ovarian, tubal, or peritoneal cancers.  Pelvic exam and Pap test. ? This may be done every 3 years starting at age 81. ? Starting at age 8, this may be done every 5 years if you have a Pap test in combination with an HPV test. Other tests  STD (sexually transmitted disease) testing, if you are at risk.  Bone density scan. This is done to screen for osteoporosis. You may have this scan if you are at high risk  for osteoporosis. Talk with your health care provider about your test results, treatment options, and if necessary, the need for more tests. Follow these instructions at home: Eating and drinking  Eat a diet that includes fresh fruits and vegetables, whole grains, lean protein, and low-fat dairy products.  Take vitamin and mineral supplements as recommended by your health care provider.  Do not drink alcohol if: ? Your health care  provider tells you not to drink. ? You are pregnant, may be pregnant, or are planning to become pregnant.  If you drink alcohol: ? Limit how much you have to 0-1 drink a day. ? Be aware of how much alcohol is in your drink. In the [place] one drink equals one 12 oz bottle of beer (355 mL), one 5 oz glass of wine (148 mL), or one 1 oz glass of hard liquor (44 mL).   Lifestyle  Take daily care of your teeth and gums. Brush your teeth every morning and night with fluoride toothpaste. Floss one time each day.  Stay active. Exercise for at least 30 minutes 5 or more days each week.  Do not use any products that contain nicotine or tobacco, such as cigarettes, e-cigarettes, and chewing tobacco. If you need help quitting, ask your health care provider.  Do not use drugs.  If you are sexually active, practice safe sex. Use a condom or other form of protection to prevent STIs (sexually transmitted infections).  If you do not wish to become pregnant, use a form of birth control. If you plan to become pregnant, see your health care provider for a prepregnancy visit.  If told by your health care provider, take low-dose aspirin daily starting at age 61.  Find healthy ways to cope with stress, such as: ? Meditation, yoga, or listening to music. ? Journaling. ? Talking to a trusted person. ? Spending time with friends and family. Safety  Always wear your seat belt while driving or riding in a vehicle.  Do not drive: ? If you have been drinking alcohol. Do not ride with someone who has been drinking. ? When you are tired or distracted. ? While texting.  Wear a helmet and other protective equipment during sports activities.  If you have firearms in your house, make sure you follow all gun safety procedures. What's next?  Visit your health care provider once a year for an annual wellness visit.  Ask your health care provider how often you should have your eyes and teeth checked.  Stay up to  date on all vaccines. This information is not intended to replace advice given to you by your health care provider. Make sure you discuss any questions you have with your health care provider. Document Revised: 07/28/2020 Document Reviewed: 07/05/2018 Elsevier Patient Education  2021 [employer].

## 2020-12-23 NOTE — Assessment & Plan Note (Signed)
Immunizations up-to-date. Mammogram up-to-date. Colonoscopy up-to-date, see care everywhere. Encouraged healthy diet regular exercise.  Exam today stable. Labs reviewed.

## 2020-12-28 ENCOUNTER — Encounter: Payer: Self-pay | Admitting: Primary Care

## 2021-06-28 DIAGNOSIS — M65841 Other synovitis and tenosynovitis, right hand: Secondary | ICD-10-CM | POA: Diagnosis not present

## 2021-06-29 DIAGNOSIS — M65841 Other synovitis and tenosynovitis, right hand: Secondary | ICD-10-CM | POA: Diagnosis not present

## 2021-07-08 DIAGNOSIS — M65841 Other synovitis and tenosynovitis, right hand: Secondary | ICD-10-CM | POA: Diagnosis not present

## 2021-07-22 DIAGNOSIS — M65841 Other synovitis and tenosynovitis, right hand: Secondary | ICD-10-CM | POA: Diagnosis not present

## 2021-07-30 ENCOUNTER — Encounter
Admission: RE | Admit: 2021-07-30 | Discharge: 2021-07-30 | Disposition: A | Discharge status: Home / Self Care | Payer: BLUE CROSS/BLUE SHIELD | Source: Ambulatory Visit | Attending: Specialist | Admitting: Specialist

## 2021-07-30 ENCOUNTER — Other Ambulatory Visit: Payer: Self-pay

## 2021-07-30 ENCOUNTER — Other Ambulatory Visit: Payer: Self-pay | Admitting: Specialist

## 2021-07-30 HISTORY — DX: Iron deficiency anemia, unspecified: D50.9

## 2021-07-30 NOTE — H&P (Signed)
PREOPERATIVE H&P  Chief Complaint: M65.841 Other synovitis and tenosynovitis, right hand  HPI: Denise Roy is a 65 y.o. female who presents for preoperative history and physical with a diagnosis of M65.841 Other synovitis and tenosynovitis, right hand.  She has had swelling over the dorsum of the right hand and wrist extensor tendons for months.  Conservative treatment is failed to resolve this.  Due to the possibility of extensor tendon rupture I have advised we perform a tenosynovectomy to prevent this.  Patient is in agreement with this.  Symptoms are rated as moderate to severe, and have been worsening.  This is significantly impairing activities of daily living.  She has elected for surgical management.   No past medical history on file. No past surgical history on file. Social History   Socioeconomic History   Marital status: Married    Spouse name: Not on file   Number of children: Not on file   Years of education: Not on file   Highest education level: Not on file  Occupational History   Not on file  Tobacco Use   Smoking status: Never   Smokeless tobacco: Never  Substance and Sexual Activity   Alcohol use: No   Drug use: No   Sexual activity: Never    Birth control/protection: Post-menopausal  Other Topics Concern   Not on file  Social History Narrative   Not on file   Social Determinants of Health   Financial Resource Strain: Not on file  Food Insecurity: Not on file  Transportation Needs: Not on file  Physical Activity: Not on file  Stress: Not on file  Social Connections: Not on file   Family History  Problem Relation Age of Onset   Hypertension Mother    Cancer Father    Allergies  Allergen Reactions   Erythromycin     Stomach upset   Prior to Admission medications   Medication Sig Start Date End Date Taking? Authorizing Provider  BIOTIN PO Take 1 tablet by mouth daily.   Yes [provider]  Prenatal Vit-Fe Fumarate-FA (PRENATAL  MULTIVITAMIN) TABS tablet Take 1 tablet by mouth daily at 12 noon.   Yes [provider]  estradiol (ESTRACE VAGINAL) 0.1 MG/GM vaginal cream Place 1 Applicatorful vaginally 3 (three) times a week. Patient not taking: Reported on 07/29/2021 12/23/20   Pleas Koch, NP     Positive ROS: All other systems have been reviewed and were otherwise negative with the exception of those mentioned in the HPI and as above.  Physical Exam: General: Alert, no acute distress Cardiovascular: No pedal edema. Heart is regular and without murmur.  Respiratory: No cyanosis, no use of accessory musculature. Lungs are clear. GI: No organomegaly, abdomen is soft and non-tender Skin: No lesions in the area of chief complaint Neurologic: Sensation intact distally Psychiatric: Patient is competent for consent with normal mood and affect Lymphatic: No axillary or cervical lymphadenopathy  MUSCULOSKELETAL: Patient is in good health.  She does have boggy swollen tenosynovium tenosynovium on the dorsum of the right hand and wrist.  Extensor function is normal.  Grips good.  Wrist motion good.  No tenderness.  Assessment: M65.841 Other synovitis and tenosynovitis, right hand  Plan: Plan for Procedure(s): RADICAL EXCISION OF BURSA, SYNOVIA OF WRIST OR FOREARM TENDON SHEATHS (SURG) CODE 40347  The risks benefits and alternatives were discussed with the patient including but not limited to the risks of nonoperative treatment, versus surgical intervention including infection, bleeding, nerve injury,  blood  clots, cardiopulmonary complications, morbidity, mortality, among others, and they were willing to proceed.   Park Breed, MD [phone]   07/30/2021 12:35 PM

## 2021-07-30 NOTE — Patient Instructions (Addendum)
Your procedure is scheduled on: 08/03/2021 Report to the Registration Desk on the 1st floor of the [facility]. To find out your arrival time, please call [phone] between 1PM - 3PM on: 08/02/2021  REMEMBER: Instructions that are not followed completely may result in serious medical risk, up to and including death; or upon the discretion of your surgeon and anesthesiologist your surgery may need to be rescheduled.  Do not eat food after midnight the night before surgery.  No gum chewing, lozengers or hard candies.  You may however, drink CLEAR liquids up to 2 hours before you are scheduled to arrive for your surgery. Do not drink anything within 2 hours of your scheduled arrival time.  Clear liquids include: - water  - apple juice without pulp - gatorade (not RED, PURPLE, OR BLUE) - black coffee or tea (Do NOT add milk or creamers to the coffee or tea) Do NOT drink anything that is not on this list.   One week prior to surgery: Stop Anti-inflammatories (NSAIDS) such as Advil, Aleve, Ibuprofen, Motrin, Naproxen, Naprosyn and Aspirin based products such as Excedrin, Goodys Powder, BC Powder. Stop ANY OVER THE COUNTER vitamins and supplements until after surgery. You may however, continue to take Tylenol if needed for pain up until the day of surgery.  No Alcohol for 24 hours before or after surgery.  No Smoking including e-cigarettes for 24 hours prior to surgery.  No chewable tobacco products for at least 6 hours prior to surgery.  No nicotine patches on the day of surgery.  Do not use any "recreational" drugs for at least a week prior to your surgery.  Please be advised that the combination of cocaine and anesthesia may have negative outcomes, up to and including death. If you test positive for cocaine, your surgery will be cancelled.  On the morning of surgery brush your teeth with toothpaste and water, you may rinse your mouth with mouthwash if you wish. Do not swallow  any toothpaste or mouthwash.  Use CHG Soap or wipes as directed on instruction sheet.  Do not wear jewelry, make-up, hairpins, clips or nail polish.  Do not wear lotions, powders, or perfumes.   Do not shave body from the neck down 48 hours prior to surgery just in case you cut yourself which could leave a site for infection.  Also, freshly shaved skin may become irritated if using the CHG soap.  Contact lenses, hearing aids and dentures may not be worn into surgery.  Do not bring valuables to the hospital. [facility] is not responsible for any missing/lost belongings or valuables.   Notify your doctor if there is any change in your medical condition (cold, fever, infection).  Wear comfortable clothing (specific to your surgery type) to the hospital.  If you are being discharged the day of surgery, you will not be allowed to drive home. You will need a responsible adult (18 years or older) to drive you home and stay with you that night.   If you are taking public transportation, you will need to have a responsible adult (18 years or older) with you. Please confirm with your physician that it is acceptable to use public transportation.   Please call the [facility] at [phone] if you have any questions about these instructions.  Surgery Visitation Policy:  Patients undergoing a surgery or procedure may have one family member or support person with them as long as that person is not COVID-19 positive or experiencing  its symptoms.  That person may remain in the waiting area during the procedure and may rotate out with other people.  Inpatient Visitation:    Visiting hours are 7 a.m. to 8 p.m. Up to two visitors ages 16+ are allowed at one time in a patient room. The visitors may rotate out with other people during the day. Visitors must check out when they leave, or other visitors will not be allowed. One designated support person may remain overnight. The  visitor must pass COVID-19 screenings, use hand sanitizer when entering and exiting the patient's room and wear a mask at all times, including in the patient's room. Patients must also wear a mask when staff or their visitor are in the room. Masking is required regardless of vaccination status.

## 2021-08-02 ENCOUNTER — Other Ambulatory Visit: Payer: Self-pay

## 2021-08-02 ENCOUNTER — Encounter: Payer: Self-pay | Admitting: Urgent Care

## 2021-08-02 ENCOUNTER — Encounter
Admission: RE | Admit: 2021-08-02 | Discharge: 2021-08-02 | Disposition: A | Discharge status: Home / Self Care | Payer: BLUE CROSS/BLUE SHIELD | Source: Ambulatory Visit | Attending: Specialist | Admitting: Specialist

## 2021-08-02 DIAGNOSIS — M65841 Other synovitis and tenosynovitis, right hand: Secondary | ICD-10-CM | POA: Diagnosis not present

## 2021-08-02 DIAGNOSIS — Z01818 Encounter for other preprocedural examination: Secondary | ICD-10-CM | POA: Insufficient documentation

## 2021-08-02 LAB — COMPREHENSIVE METABOLIC PANEL
ALT: 20 U/L (ref 0–44)
AST: 21 U/L (ref 15–41)
Albumin: 4.1 g/dL (ref 3.5–5.0)
Alkaline Phosphatase: 85 U/L (ref 38–126)
Anion gap: 5 (ref 5–15)
BUN: 13 mg/dL (ref 8–23)
CO2: 29 mmol/L (ref 22–32)
Calcium: 9 mg/dL (ref 8.9–10.3)
Chloride: 104 mmol/L (ref 98–111)
Creatinine, Ser: 0.85 mg/dL (ref 0.44–1.00)
GFR, Estimated: >60 mL/min (ref >60)
Glucose, Bld: 98 mg/dL (ref 70–99)
Potassium: 4 mmol/L (ref 3.5–5.1)
Sodium: 138 mmol/L (ref 135–145)
Total Bilirubin: 0.8 mg/dL (ref 0.3–1.2)
Total Protein: 6.8 g/dL (ref 6.5–8.1)

## 2021-08-02 LAB — CBC
HCT: 37 % (ref 36.0–46.0)
Hemoglobin: 12.1 g/dL (ref 12.0–15.0)
MCH: 30.1 pg (ref 26.0–34.0)
MCHC: 32.7 g/dL (ref 30.0–36.0)
MCV: 92 fL (ref 80.0–100.0)
Platelets: 316 10*3/uL (ref 150–400)
RBC: 4.02 MIL/uL (ref 3.87–5.11)
RDW: 12.4 % (ref 11.5–15.5)
WBC: 5.4 10*3/uL (ref 4.0–10.5)
nRBC: 0 % (ref 0.0–0.2)

## 2021-08-02 LAB — PROTIME-INR
INR: 1 (ref 0.8–1.2)
Prothrombin Time: 13 seconds (ref 11.4–15.2)

## 2021-08-02 LAB — APTT: aPTT: 29 seconds (ref 24–36)

## 2021-08-03 ENCOUNTER — Ambulatory Visit: Payer: BLUE CROSS/BLUE SHIELD | Admitting: Urgent Care

## 2021-08-03 ENCOUNTER — Encounter
Admission: RE | Disposition: A | Discharge status: Home / Self Care | Payer: Self-pay | Source: Ambulatory Visit | Attending: Specialist

## 2021-08-03 ENCOUNTER — Ambulatory Visit
Admission: RE | Admit: 2021-08-03 | Discharge: 2021-08-03 | Disposition: A | Discharge status: Home / Self Care | Payer: BLUE CROSS/BLUE SHIELD | Source: Ambulatory Visit | Attending: Specialist | Admitting: Specialist

## 2021-08-03 ENCOUNTER — Encounter: Payer: Self-pay | Admitting: Specialist

## 2021-08-03 DIAGNOSIS — M65841 Other synovitis and tenosynovitis, right hand: Secondary | ICD-10-CM | POA: Diagnosis not present

## 2021-08-03 HISTORY — PX: TENOSYNOVECTOMY: SHX6110

## 2021-08-03 SURGERY — TENOSYNOVECTOMY
Anesthesia: General | Site: Wrist | Laterality: Right

## 2021-08-03 MED ORDER — OXYCODONE HCL 5 MG PO TABS: 5.0000 mg | ORAL_TABLET | Freq: Once | ORAL | Status: DC | PRN

## 2021-08-03 MED ORDER — CLINDAMYCIN PHOSPHATE 600 MG/50ML IV SOLN: 600.0000 mg | INTRAVENOUS | Status: AC

## 2021-08-03 MED ORDER — PROMETHAZINE HCL 25 MG/ML IJ SOLN: 6.2500 mg | INTRAMUSCULAR | Status: DC | PRN

## 2021-08-03 MED ORDER — CEFAZOLIN SODIUM-DEXTROSE 2-4 GM/100ML-% IV SOLN
2.0000 g | INTRAVENOUS | Status: AC
  Administered 2021-08-03: 2 g via INTRAVENOUS

## 2021-08-03 MED ORDER — GABAPENTIN 300 MG PO CAPS: 300.0000 mg | ORAL_CAPSULE | ORAL | Status: AC

## 2021-08-03 MED ORDER — CHLORHEXIDINE GLUCONATE CLOTH 2 % EX PADS
6.0000 | MEDICATED_PAD | Freq: Once | CUTANEOUS | Status: AC
  Administered 2021-08-03: 6 via TOPICAL

## 2021-08-03 MED ORDER — MEPERIDINE HCL 25 MG/ML IJ SOLN: 6.2500 mg | INTRAMUSCULAR | Status: DC | PRN

## 2021-08-03 MED ORDER — CEFAZOLIN SODIUM-DEXTROSE 2-4 GM/100ML-% IV SOLN
INTRAVENOUS | Status: AC
  Filled 2021-08-03: qty 100

## 2021-08-03 MED ORDER — PREGABALIN 50 MG PO CAPS: 50.0000 mg | ORAL_CAPSULE | Freq: Three times a day (TID) | ORAL | 2 refills | Status: DC

## 2021-08-03 MED ORDER — FAMOTIDINE 20 MG PO TABS
ORAL_TABLET | ORAL | Status: AC
  Administered 2021-08-03: 20 mg via ORAL
  Filled 2021-08-03: qty 1

## 2021-08-03 MED ORDER — DEXMEDETOMIDINE (PRECEDEX) IN NS 20 MCG/5ML (4 MCG/ML) IV SYRINGE
PREFILLED_SYRINGE | INTRAVENOUS | Status: AC
  Filled 2021-08-03: qty 5

## 2021-08-03 MED ORDER — LACTATED RINGERS IV SOLN: INTRAVENOUS | Status: DC

## 2021-08-03 MED ORDER — MELOXICAM 7.5 MG PO TABS: 15.0000 mg | ORAL_TABLET | ORAL | Status: AC

## 2021-08-03 MED ORDER — PROPOFOL 1000 MG/100ML IV EMUL
INTRAVENOUS | Status: AC
  Filled 2021-08-03: qty 100

## 2021-08-03 MED ORDER — ORAL CARE MOUTH RINSE: 15.0000 mL | Freq: Once | OROMUCOSAL | Status: AC

## 2021-08-03 MED ORDER — LIDOCAINE HCL (CARDIAC) PF 100 MG/5ML IV SOSY
PREFILLED_SYRINGE | INTRAVENOUS | Status: DC | PRN
  Administered 2021-08-03: 50 mg via INTRAVENOUS
  Administered 2021-08-03: 30 mg via INTRAVENOUS
  Administered 2021-08-03: 20 mg via INTRAVENOUS

## 2021-08-03 MED ORDER — FENTANYL CITRATE (PF) 100 MCG/2ML IJ SOLN
INTRAMUSCULAR | Status: DC | PRN
  Administered 2021-08-03: 25 ug via INTRAVENOUS
  Administered 2021-08-03 (×2): 12.5 ug via INTRAVENOUS

## 2021-08-03 MED ORDER — DEXMEDETOMIDINE (PRECEDEX) IN NS 20 MCG/5ML (4 MCG/ML) IV SYRINGE
PREFILLED_SYRINGE | INTRAVENOUS | Status: DC | PRN
  Administered 2021-08-03: 8 ug via INTRAVENOUS
  Administered 2021-08-03: 4 ug via INTRAVENOUS
  Administered 2021-08-03: 8 ug via INTRAVENOUS

## 2021-08-03 MED ORDER — BUPIVACAINE HCL (PF) 0.5 % IJ SOLN
INTRAMUSCULAR | Status: AC
  Filled 2021-08-03: qty 30

## 2021-08-03 MED ORDER — ACETAMINOPHEN 10 MG/ML IV SOLN
INTRAVENOUS | Status: AC
  Filled 2021-08-03: qty 100

## 2021-08-03 MED ORDER — PROPOFOL 500 MG/50ML IV EMUL
INTRAVENOUS | Status: DC | PRN
Start: 2021-08-03 — End: 2021-08-03
  Administered 2021-08-03: 125 ug/kg/min via INTRAVENOUS

## 2021-08-03 MED ORDER — CHLORHEXIDINE GLUCONATE 0.12 % MT SOLN: 15.0000 mL | Freq: Once | OROMUCOSAL | Status: AC

## 2021-08-03 MED ORDER — MELOXICAM 7.5 MG PO TABS
ORAL_TABLET | ORAL | Status: AC
  Administered 2021-08-03: 15 mg via ORAL
  Filled 2021-08-03: qty 2

## 2021-08-03 MED ORDER — ACETAMINOPHEN 10 MG/ML IV SOLN
INTRAVENOUS | Status: DC | PRN
  Administered 2021-08-03: 1000 mg via INTRAVENOUS

## 2021-08-03 MED ORDER — ROCURONIUM BROMIDE 10 MG/ML (PF) SYRINGE
PREFILLED_SYRINGE | INTRAVENOUS | Status: AC
  Filled 2021-08-03: qty 10

## 2021-08-03 MED ORDER — DEXAMETHASONE SODIUM PHOSPHATE 10 MG/ML IJ SOLN
INTRAMUSCULAR | Status: DC | PRN
  Administered 2021-08-03: 6 mg via INTRAVENOUS

## 2021-08-03 MED ORDER — FENTANYL CITRATE (PF) 100 MCG/2ML IJ SOLN: 25.0000 ug | INTRAMUSCULAR | Status: DC | PRN

## 2021-08-03 MED ORDER — PHENYLEPHRINE HCL (PRESSORS) 10 MG/ML IV SOLN
INTRAVENOUS | Status: AC
  Filled 2021-08-03: qty 1

## 2021-08-03 MED ORDER — CHLORHEXIDINE GLUCONATE 0.12 % MT SOLN
OROMUCOSAL | Status: AC
  Administered 2021-08-03: 15 mL via OROMUCOSAL
  Filled 2021-08-03: qty 15

## 2021-08-03 MED ORDER — OXYCODONE HCL 5 MG/5ML PO SOLN: 5.0000 mg | Freq: Once | ORAL | Status: DC | PRN

## 2021-08-03 MED ORDER — MELOXICAM 15 MG PO TABS: 15.0000 mg | ORAL_TABLET | Freq: Every day | ORAL | 3 refills | Status: DC

## 2021-08-03 MED ORDER — FENTANYL CITRATE (PF) 100 MCG/2ML IJ SOLN
INTRAMUSCULAR | Status: AC
  Filled 2021-08-03: qty 2

## 2021-08-03 MED ORDER — HYDROCODONE-ACETAMINOPHEN 5-325 MG PO TABS: 1.0000 | ORAL_TABLET | Freq: Four times a day (QID) | ORAL | 0 refills | Status: DC | PRN

## 2021-08-03 MED ORDER — LIDOCAINE HCL (PF) 2 % IJ SOLN
INTRAMUSCULAR | Status: AC
  Filled 2021-08-03: qty 5

## 2021-08-03 MED ORDER — ONDANSETRON HCL 4 MG/2ML IJ SOLN
INTRAMUSCULAR | Status: DC | PRN
  Administered 2021-08-03: 4 mg via INTRAVENOUS

## 2021-08-03 MED ORDER — SODIUM CHLORIDE 0.9 % IR SOLN
Status: DC | PRN
  Administered 2021-08-03: 500 mL

## 2021-08-03 MED ORDER — FAMOTIDINE 20 MG PO TABS: 20.0000 mg | ORAL_TABLET | Freq: Once | ORAL | Status: AC

## 2021-08-03 MED ORDER — LIDOCAINE HCL (PF) 2 % IJ SOLN
INTRAMUSCULAR | Status: AC
  Filled 2021-08-03: qty 10

## 2021-08-03 MED ORDER — ONDANSETRON HCL 4 MG/2ML IJ SOLN
INTRAMUSCULAR | Status: AC
  Filled 2021-08-03: qty 2

## 2021-08-03 MED ORDER — BUPIVACAINE HCL 0.5 % IJ SOLN
INTRAMUSCULAR | Status: DC | PRN
  Administered 2021-08-03: 16 mL

## 2021-08-03 MED ORDER — PROPOFOL 10 MG/ML IV BOLUS
INTRAVENOUS | Status: DC | PRN
  Administered 2021-08-03: 100 mg via INTRAVENOUS

## 2021-08-03 MED ORDER — CLINDAMYCIN PHOSPHATE 600 MG/50ML IV SOLN
INTRAVENOUS | Status: AC
  Administered 2021-08-03: 600 mg via INTRAVENOUS
  Filled 2021-08-03: qty 50

## 2021-08-03 MED ORDER — GABAPENTIN 300 MG PO CAPS
ORAL_CAPSULE | ORAL | Status: AC
  Administered 2021-08-03: 300 mg via ORAL
  Filled 2021-08-03: qty 1

## 2021-08-03 MED ORDER — DEXAMETHASONE SODIUM PHOSPHATE 10 MG/ML IJ SOLN
INTRAMUSCULAR | Status: AC
  Filled 2021-08-03: qty 1

## 2021-08-03 SURGICAL SUPPLY — 36 items
APL PRP STRL LF DISP 70% ISPRP (MISCELLANEOUS) ×2
BLADE SURG MINI STRL (BLADE) ×3 IMPLANT
BNDG ESMARK 4X12 TAN STRL LF (GAUZE/BANDAGES/DRESSINGS) ×3 IMPLANT
CHLORAPREP W/TINT 26 (MISCELLANEOUS) ×3 IMPLANT
CUFF TOURN SGL QUICK 12 (TOURNIQUET CUFF) ×2 IMPLANT
DRSG GAUZE FLUFF 36X18 (GAUZE/BANDAGES/DRESSINGS) ×3 IMPLANT
ELECT REM PT RETURN 9FT ADLT (ELECTROSURGICAL) ×3
ELECTRODE REM PT RTRN 9FT ADLT (ELECTROSURGICAL) ×2 IMPLANT
GAUZE 4X4 16PLY ~~LOC~~+RFID DBL (SPONGE) ×3 IMPLANT
GAUZE SPONGE 4X4 12PLY STRL (GAUZE/BANDAGES/DRESSINGS) ×3 IMPLANT
GAUZE XEROFORM 1X8 LF (GAUZE/BANDAGES/DRESSINGS) ×3 IMPLANT
GLOVE SURG ORTHO LTX SZ8 (GLOVE) ×3 IMPLANT
GOWN STRL REUS W/ TWL LRG LVL3 (GOWN DISPOSABLE) ×2 IMPLANT
GOWN STRL REUS W/TWL LRG LVL3 (GOWN DISPOSABLE) ×3
GOWN STRL REUS W/TWL LRG LVL4 (GOWN DISPOSABLE) ×3 IMPLANT
KIT TURNOVER KIT A (KITS) IMPLANT
MANIFOLD NEPTUNE II (INSTRUMENTS) ×3 IMPLANT
NS IRRIG 500ML POUR BTL (IV SOLUTION) ×3 IMPLANT
PACK EXTREMITY ARMC (MISCELLANEOUS) ×3 IMPLANT
PAD CAST CTTN 4X4 STRL (SOFTGOODS) ×2 IMPLANT
PAD PREP 24X41 OB/GYN DISP (PERSONAL CARE ITEMS) ×3 IMPLANT
PADDING CAST COTTON 4X4 STRL (SOFTGOODS) ×3
SPLINT CAST 1 STEP 3X12 (MISCELLANEOUS) ×3 IMPLANT
STOCKINETTE 48X4 2 PLY STRL (GAUZE/BANDAGES/DRESSINGS) ×1 IMPLANT
STOCKINETTE BIAS CUT 4 980044 (GAUZE/BANDAGES/DRESSINGS) ×3 IMPLANT
STOCKINETTE STRL 4IN 9604848 (GAUZE/BANDAGES/DRESSINGS) ×3 IMPLANT
STRAP SAFETY 5IN WIDE (MISCELLANEOUS) ×3 IMPLANT
SUT ETHILON 4-0 (SUTURE) ×3
SUT ETHILON 4-0 FS2 18XMFL BLK (SUTURE) ×2
SUT ETHILON 5-0 (SUTURE) ×3
SUT ETHILON 5-0 C-3 18XMFL BLK (SUTURE) ×2
SUT ETHILON 5-0 FS-2 18 BLK (SUTURE) ×2 IMPLANT
SUT VIC AB 4-0 FS2 27 (SUTURE) ×3 IMPLANT
SUTURE ETHLN 4-0 FS2 18XMF BLK (SUTURE) ×2 IMPLANT
SUTURE ETHLN 5-0 C3 18XMF BLK (SUTURE) ×2 IMPLANT
WATER STERILE IRR 500ML POUR (IV SOLUTION) ×1 IMPLANT

## 2021-08-03 NOTE — Discharge Instructions (Signed)

## 2021-08-03 NOTE — Op Note (Signed)
  08/03/2021  9:13 AM  PATIENT:  Denise Roy  65 y.o. female  PRE-OPERATIVE DIAGNOSIS:  M65.841 Other synovitis and tenosynovitis, right hand  POST-OPERATIVE DIAGNOSIS:  Same  PROCEDURE:  Tenosynovectomy right hand/wrist per signed consent  SURGEON:  Beya Tipps E Baljit Liebert MD  ASST:    ANESTHESIA:   General  EBL: None  TOURNIQUET TIME: 54 minutes  OPERATIVE FINDINGS: Extensive synovitis in the fourth dorsal compartment primarily.  There was mild fraying of several of the common extensors and slight fraying of the extensor indicis proprius.  First second and third compartments showed no fraying minimal synovitis.  The fifth and sixth compartment showed no fraying and minimal synovitis.  OPERATIVE PROCEDURE:   Patient was brought to the operating room and underwent general LMA anesthesia in the supine position.  The operative arm was prepped and draped in sterile fashion.  Esmarch was applied and fixed tourniquet inflated to 250 mmHg.  A longitudinal incision was made over the dorsum of the right hand/wrist in the midline over the swollen area.  Dissection was carried under loupe magnification down through subcutaneous tissue.  There was extensive synovitis surrounding the common extensors no tendons were ruptured.  There was mild fraying of the common extensors and the extensor indices proprius.  No suturing was necessary.  Careful dissection was carried out removing all diseased synovium.  The distal portion ulnarly and the proximal portion radially of the extensor retinaculum was released leaving a narrow band of intact dorsal retinaculum in place.  Good access to all tendons was obtained by flexing and extending the wrist and fingers.  Significant disease is found in this area.  After all diseased appearing tissue was removed, the wound was irrigated and closed with 5-0 nylon suture.  1/2% Marcaine was placed in the wound.  Dry sterile compressive hand dressing with volar splint was applied.   Tourniquet was released with good return of blood flow to the hand.  The patient was awakened and taken to PACU in good condition.  Park Breed, MD

## 2021-08-03 NOTE — Anesthesia Preprocedure Evaluation (Signed)
Anesthesia Evaluation  Patient identified by MRN, date of birth, ID band Patient awake    Reviewed: Allergy & Precautions, NPO status , Patient's Chart, lab work & pertinent test results  History of Anesthesia Complications Negative for: history of anesthetic complications  Airway Mallampati: II  TM Distance: >3 FB Neck ROM: Full    Dental no notable dental hx.    Pulmonary neg pulmonary ROS, neg sleep apnea, neg COPD,    breath sounds clear to auscultation- rhonchi (-) wheezing      Cardiovascular Exercise Tolerance: Good (-) hypertension(-) CAD and (-) Past MI  Rhythm:Regular Rate:Normal - Systolic murmurs and - Diastolic murmurs    Neuro/Psych negative neurological ROS  negative psych ROS   GI/Hepatic negative GI ROS, Neg liver ROS,   Endo/Other  negative endocrine ROSneg diabetes  Renal/GU negative Renal ROS     Musculoskeletal negative musculoskeletal ROS (+)   Abdominal (+) - obese,   Peds  Hematology negative hematology ROS (+)   Anesthesia Other Findings   Reproductive/Obstetrics                             Anesthesia Physical Anesthesia Plan  ASA: 1  Anesthesia Plan: General   Post-op Pain Management:    Induction: Intravenous  PONV Risk Score and Plan: 2 and Ondansetron and Dexamethasone  Airway Management Planned: LMA  Additional Equipment:   Intra-op Plan:   Post-operative Plan:   Informed Consent: I have reviewed the patients History and Physical, chart, labs and discussed the procedure including the risks, benefits and alternatives for the proposed anesthesia with the patient or authorized representative who has indicated his/her understanding and acceptance.     Dental advisory given  Plan Discussed with: CRNA and Anesthesiologist  Anesthesia Plan Comments:         Anesthesia Quick Evaluation

## 2021-08-03 NOTE — Anesthesia Postprocedure Evaluation (Signed)
Anesthesia Post Note  Patient: Denise Roy  Procedure(s) Performed: Tenosynovectomy right hand/wrist per signed consent (Right: Wrist)  Patient location during evaluation: PACU Anesthesia Type: General Level of consciousness: awake and alert and oriented Pain management: pain level controlled Vital Signs Assessment: post-procedure vital signs reviewed and stable Respiratory status: spontaneous breathing, nonlabored ventilation and respiratory function stable Cardiovascular status: blood pressure returned to baseline and stable Postop Assessment: no signs of nausea or vomiting Anesthetic complications: no   No notable events documented.   Last Vitals:  Vitals:   08/03/21 0945 08/03/21 1000  BP: 107/70 (!) 131/56  Pulse: (!) 54 (!) 52  Resp: 15 16  Temp: (!) 36.4 C 36.4 C  SpO2: 98% 98%    Last Pain:  Vitals:   08/03/21 1000  TempSrc: Oral  PainSc: 0-No pain                 Aadin Gaut

## 2021-08-03 NOTE — Transfer of Care (Signed)
Immediate Anesthesia Transfer of Care Note  Patient: Denise Roy  Procedure(s) Performed: Tenosynovectomy right hand/wrist per signed consent (Right: Wrist)  Patient Location: PACU  Anesthesia Type:General  Level of Consciousness: drowsy  Airway & Oxygen Therapy: Patient Spontanous Breathing and Patient connected to face mask oxygen  Post-op Assessment: Report given to RN and Post -op Vital signs reviewed and stable  Post vital signs: Reviewed and stable  Last Vitals:  Vitals Value Taken Time  BP 92/57 08/03/21 0915  Temp 36.4 C 08/03/21 0915  Pulse 51 08/03/21 0920  Resp 12 08/03/21 0920  SpO2 100 % 08/03/21 0920  Vitals shown include unvalidated device data.  Last Pain:  Vitals:   08/03/21 0634  TempSrc: Tympanic  PainSc: 0-No pain      Patients Stated Pain Goal: 0 (01/25/21 4825)  Complications: No notable events documented.

## 2021-08-03 NOTE — H&P (Signed)
THE PATIENT WAS SEEN PRIOR TO SURGERY TODAY.  HISTORY, ALLERGIES, HOME MEDICATIONS AND OPERATIVE PROCEDURE WERE REVIEWED. RISKS AND BENEFITS OF SURGERY DISCUSSED WITH PATIENT AGAIN.  NO CHANGES FROM INITIAL HISTORY AND PHYSICAL NOTED.    

## 2021-08-04 LAB — SURGICAL PATHOLOGY

## 2021-08-19 DIAGNOSIS — M25631 Stiffness of right wrist, not elsewhere classified: Secondary | ICD-10-CM | POA: Diagnosis not present

## 2021-08-24 DIAGNOSIS — M25631 Stiffness of right wrist, not elsewhere classified: Secondary | ICD-10-CM | POA: Diagnosis not present

## 2021-09-02 DIAGNOSIS — M25631 Stiffness of right wrist, not elsewhere classified: Secondary | ICD-10-CM | POA: Diagnosis not present

## 2021-09-16 DIAGNOSIS — M25631 Stiffness of right wrist, not elsewhere classified: Secondary | ICD-10-CM | POA: Diagnosis not present

## 2021-10-25 DIAGNOSIS — M65831 Other synovitis and tenosynovitis, right forearm: Secondary | ICD-10-CM | POA: Diagnosis not present

## 2021-10-26 DIAGNOSIS — M25631 Stiffness of right wrist, not elsewhere classified: Secondary | ICD-10-CM | POA: Diagnosis not present

## 2021-12-08 ENCOUNTER — Other Ambulatory Visit: Payer: Self-pay | Admitting: Primary Care

## 2021-12-08 DIAGNOSIS — D509 Iron deficiency anemia, unspecified: Secondary | ICD-10-CM

## 2021-12-08 DIAGNOSIS — E785 Hyperlipidemia, unspecified: Secondary | ICD-10-CM

## 2021-12-20 ENCOUNTER — Other Ambulatory Visit (INDEPENDENT_AMBULATORY_CARE_PROVIDER_SITE_OTHER): Payer: BLUE CROSS/BLUE SHIELD

## 2021-12-20 ENCOUNTER — Other Ambulatory Visit: Payer: Self-pay

## 2021-12-20 DIAGNOSIS — E785 Hyperlipidemia, unspecified: Secondary | ICD-10-CM

## 2021-12-20 DIAGNOSIS — D509 Iron deficiency anemia, unspecified: Secondary | ICD-10-CM | POA: Diagnosis not present

## 2021-12-20 LAB — CBC
HCT: 37.2 % (ref 36.0–46.0)
Hemoglobin: 12.5 g/dL (ref 12.0–15.0)
MCHC: 33.5 g/dL (ref 30.0–36.0)
MCV: 93.1 fl (ref 78.0–100.0)
Platelets: 272 10*3/uL (ref 150.0–400.0)
RBC: 4 Mil/uL (ref 3.87–5.11)
RDW: 12.9 % (ref 11.5–15.5)
WBC: 4.5 10*3/uL (ref 4.0–10.5)

## 2021-12-21 LAB — COMPREHENSIVE METABOLIC PANEL
ALT: 19 U/L (ref 0–35)
AST: 21 U/L (ref 0–37)
Albumin: 4.4 g/dL (ref 3.5–5.2)
Alkaline Phosphatase: 75 U/L (ref 39–117)
BUN: 14 mg/dL (ref 6–23)
CO2: 32 mEq/L (ref 19–32)
Calcium: 9.6 mg/dL (ref 8.4–10.5)
Chloride: 106 mEq/L (ref 96–112)
Creatinine, Ser: 0.97 mg/dL (ref 0.40–1.20)
GFR: 61.18 mL/min (ref >60.00)
Glucose, Bld: 93 mg/dL (ref 70–99)
Potassium: 4.9 mEq/L (ref 3.5–5.1)
Sodium: 141 mEq/L (ref 135–145)
Total Bilirubin: 0.6 mg/dL (ref 0.2–1.2)
Total Protein: 6.6 g/dL (ref 6.0–8.3)

## 2021-12-21 LAB — IBC + FERRITIN
Ferritin: 29 ng/mL (ref 10.0–291.0)
Iron: 62 ug/dL (ref 42–145)
Saturation Ratios: 20.8 % (ref 20.0–50.0)
TIBC: 298.2 ug/dL (ref 250.0–450.0)
Transferrin: 213 mg/dL (ref 212.0–360.0)

## 2021-12-21 LAB — LIPID PANEL
Cholesterol: 222 mg/dL — ABNORMAL HIGH (ref 0–200)
HDL: 74 mg/dL (ref >39.00)
LDL Cholesterol: 131 mg/dL — ABNORMAL HIGH (ref 0–99)
NonHDL: 148.37
Total CHOL/HDL Ratio: 3
Triglycerides: 87 mg/dL (ref 0.0–149.0)
VLDL: 17.4 mg/dL (ref 0.0–40.0)

## 2021-12-24 ENCOUNTER — Other Ambulatory Visit: Payer: Self-pay

## 2021-12-24 ENCOUNTER — Ambulatory Visit (INDEPENDENT_AMBULATORY_CARE_PROVIDER_SITE_OTHER): Payer: BLUE CROSS/BLUE SHIELD | Admitting: Primary Care

## 2021-12-24 ENCOUNTER — Encounter: Payer: Self-pay | Admitting: Primary Care

## 2021-12-24 ENCOUNTER — Other Ambulatory Visit (HOSPITAL_COMMUNITY)
Admission: RE | Admit: 2021-12-24 | Discharge: 2021-12-24 | Disposition: A | Discharge status: Home / Self Care | Payer: BLUE CROSS/BLUE SHIELD | Source: Ambulatory Visit | Attending: Primary Care | Admitting: Primary Care

## 2021-12-24 VITALS — BP 102/78 | HR 76 | Temp 98.5°F | Ht 61.0 in | Wt 110.0 lb

## 2021-12-24 DIAGNOSIS — Z124 Encounter for screening for malignant neoplasm of cervix: Secondary | ICD-10-CM | POA: Insufficient documentation

## 2021-12-24 DIAGNOSIS — E2839 Other primary ovarian failure: Secondary | ICD-10-CM | POA: Diagnosis not present

## 2021-12-24 DIAGNOSIS — D509 Iron deficiency anemia, unspecified: Secondary | ICD-10-CM | POA: Diagnosis not present

## 2021-12-24 DIAGNOSIS — E785 Hyperlipidemia, unspecified: Secondary | ICD-10-CM

## 2021-12-24 DIAGNOSIS — Z0000 Encounter for general adult medical examination without abnormal findings: Secondary | ICD-10-CM

## 2021-12-24 DIAGNOSIS — Z1231 Encounter for screening mammogram for malignant neoplasm of breast: Secondary | ICD-10-CM

## 2021-12-24 DIAGNOSIS — N898 Other specified noninflammatory disorders of vagina: Secondary | ICD-10-CM

## 2021-12-24 MED ORDER — CLOBETASOL PROPIONATE 0.05 % EX CREA
1.0000 | TOPICAL_CREAM | Freq: Two times a day (BID) | CUTANEOUS | 0 refills | Status: DC
Start: 2021-12-24 — End: 2023-12-29

## 2021-12-24 NOTE — Progress Notes (Signed)
Subjective:    Patient ID: Denise Roy, female    DOB: Aug 21, 1956, 66 y.o.   MRN: [id number]  HPI  Denise Roy is a very pleasant 66 y.o. female who presents today for complete physical and follow up of chronic conditions.  She continues to experience a chronic, intermittent external vaginal itching and rash, occurs mildly during the day, mostly at night, will wake during the night and scratch. She was prescribed Estrace cream last year for which was ineffective. She's never been diagnosed with lichen sclerosis.   The 10-year ASCVD risk score (Arnett DK, et al., 2019) is: 3.4%   Values used to calculate the score:     Age: 25 years     Sex: Female     Is Non-Hispanic African American: No     Diabetic: No     Tobacco smoker: No     Systolic Blood Pressure: 283 mmHg     Is BP treated: No     HDL Cholesterol: 74 mg/dL     Total Cholesterol: 222 mg/dL   Immunizations: -Tetanus: 2019 -Influenza: Declines -Covid-19: 2 vaccines -Shingles: Completed Zostavax, declines Shingrix -Pneumonia: Due, declines    Diet: Fair diet.  Exercise: No regular exercise.  Eye exam: Completes annually  Dental exam: Completes semi-annually   Pap Smear: Completed in 2020, due today Mammogram: Completed in February 2022 Colonoscopy: Completed in 2019, due 2029 Bone Density: Due   BP Readings from Last 3 Encounters:  12/24/21 102/78  08/03/21 (!) 131/56  12/23/20 108/76      Review of Systems  Constitutional:  Negative for unexpected weight change.  HENT:  Negative for rhinorrhea.   Eyes:  Negative for visual disturbance.  Respiratory:  Negative for cough and shortness of breath.   Cardiovascular:  Negative for chest pain.  Gastrointestinal:  Negative for constipation and diarrhea.  Genitourinary:  Negative for difficulty urinating and vaginal discharge.       Vaginal itching, chronic  Musculoskeletal:  Negative for arthralgias and myalgias.  Skin:  Negative for rash.   Allergic/Immunologic: Negative for environmental allergies.  Neurological:  Negative for dizziness and headaches.  Psychiatric/Behavioral:  The patient is not nervous/anxious.         Past Medical History:  Diagnosis Date   Iron deficiency anemia     Social History   Socioeconomic History   Marital status: Married    Spouse name: Not on file   Number of children: Not on file   Years of education: Not on file   Highest education level: Not on file  Occupational History   Not on file  Tobacco Use   Smoking status: Never   Smokeless tobacco: Never  Substance and Sexual Activity   Alcohol use: No   Drug use: No   Sexual activity: Never    Birth control/protection: Post-menopausal  Other Topics Concern   Not on file  Social History Narrative   Not on file   Social Determinants of Health   Financial Resource Strain: Not on file  Food Insecurity: Not on file  Transportation Needs: Not on file  Physical Activity: Not on file  Stress: Not on file  Social Connections: Not on file  Intimate Partner Violence: Not on file    Past Surgical History:  Procedure Laterality Date   TENOSYNOVECTOMY Right 08/03/2021   Procedure: Tenosynovectomy right hand/wrist per signed consent;  Surgeon: Earnestine Leys, MD;  Location: [facility] ORS;  Service: Orthopedics;  Laterality: Right;   TRIGGER FINGER RELEASE  Right     Family History  Problem Relation Age of Onset   Hypertension Mother    Cancer Father     Allergies  Allergen Reactions   Erythromycin     Stomach upset    Current Outpatient Medications on File Prior to Visit  Medication Sig Dispense Refill   BIOTIN PO Take 1 tablet by mouth daily.     Prenatal Vit-Fe Fumarate-FA (PRENATAL MULTIVITAMIN) TABS tablet Take 1 tablet by mouth daily at 12 noon.     No current facility-administered medications on file prior to visit.    BP 102/78    Pulse 76    Temp 98.5 F (36.9 C) (Temporal)    Ht 5\' 1"  (1.549 m)    Wt 110 lb (49.9  kg)    SpO2 97%    BMI 20.78 kg/m  Objective:   Physical Exam HENT:     Right Ear: Tympanic membrane and ear canal normal.     Left Ear: Tympanic membrane and ear canal normal.     Nose: Nose normal.  Eyes:     Conjunctiva/sclera: Conjunctivae normal.     Pupils: Pupils are equal, round, and reactive to light.  Neck:     Thyroid: No thyromegaly.  Cardiovascular:     Rate and Rhythm: Normal rate and regular rhythm.     Heart sounds: No murmur heard. Pulmonary:     Effort: Pulmonary effort is normal.     Breath sounds: Normal breath sounds. No rales.  Abdominal:     General: Bowel sounds are normal.     Palpations: Abdomen is soft.     Tenderness: There is no abdominal tenderness.  Genitourinary:    Labia:        Right: Rash present. No tenderness or lesion.        Left: Rash present. No tenderness or lesion.      Vagina: Normal.     Cervix: Normal.     Uterus: Normal.      Adnexa: Right adnexa normal and left adnexa normal.     Comments: Evidence of rash to labia majora and minora with slight pinkish discoloration and evidence of scratching. Musculoskeletal:        General: Normal range of motion.     Cervical back: Neck supple.  Lymphadenopathy:     Cervical: No cervical adenopathy.  Skin:    General: Skin is warm and dry.     Findings: No rash.  Neurological:     Mental Status: She is alert and oriented to person, place, and time.     Cranial Nerves: No cranial nerve deficit.     Deep Tendon Reflexes: Reflexes are normal and symmetric.  Psychiatric:        Mood and Affect: Mood normal.          Assessment & Plan:      This visit occurred during the SARS-CoV-2 public health emergency.  Safety protocols were in place, including screening questions prior to the visit, additional usage of staff PPE, and extensive cleaning of exam room while observing appropriate contact time as indicated for disinfecting solutions.

## 2021-12-24 NOTE — Assessment & Plan Note (Addendum)
Recent CBC and iron panel unremarkable. Continue to monitor.

## 2021-12-24 NOTE — Assessment & Plan Note (Addendum)
Declines Shingrix and influenza vaccines. Pap smear due, completed today. Colonoscopy up-to-date per patient, potentially due in 2029.  Bone density scan and mammogram due, orders placed.  Encouraged healthy diet regular exercise.  Exam today as noted Labs reviewed with patient.

## 2021-12-24 NOTE — Assessment & Plan Note (Addendum)
Reviewed lipid panel from earlier this week which is stable from prior years.  ASCVD risk score 3.4%.  We did discuss to increase exercise and improve her diet for which she will do.  Continue to monitor annually.

## 2021-12-24 NOTE — Assessment & Plan Note (Signed)
No improvement with estrogen cream.  Exam today more so reveals potential lichen sclerosis.  We will treat with clobetasol 0.05% cream as needed.  She will update.

## 2021-12-24 NOTE — Patient Instructions (Signed)
[facility] to schedule your mammogram and bone density scan.   Start clobetasol 0.05% cream, apply topically daily as needed.  It was a pleasure to see you today!  Preventive Care 30 Years and Older, Female Preventive care refers to lifestyle choices and visits with your health care provider that can promote health and wellness. Preventive care visits are also called wellness exams. What can I expect for my preventive care visit? Counseling Your health care provider may ask you questions about your: Medical history, including: Past medical problems. Family medical history. Pregnancy and menstrual history. History of falls. Current health, including: Memory and ability to understand (cognition). Emotional well-being. Home life and relationship well-being. Sexual activity and sexual health. Lifestyle, including: Alcohol, nicotine or tobacco, and drug use. Access to firearms. Diet, exercise, and sleep habits. Work and work Statistician. Sunscreen use. Safety issues such as seatbelt and bike helmet use. Physical exam Your health care provider will check your: Height and weight. These may be used to calculate your BMI (body mass index). BMI is a measurement that tells if you are at a healthy weight. Waist circumference. This measures the distance around your waistline. This measurement also tells if you are at a healthy weight and may help predict your risk of certain diseases, such as type 2 diabetes and high blood pressure. Heart rate and blood pressure. Body temperature. Skin for abnormal spots. What immunizations do I need? Vaccines are usually given at various ages, according to a schedule. Your health care provider will recommend vaccines for you based on your age, medical history, and lifestyle or other factors, such as travel or where you work. What tests do I need? Screening Your health care provider may recommend screening tests for certain conditions. This may  include: Lipid and cholesterol levels. Hepatitis C test. Hepatitis B test. HIV (human immunodeficiency virus) test. STI (sexually transmitted infection) testing, if you are at risk. Lung cancer screening. Colorectal cancer screening. Diabetes screening. This is done by checking your blood sugar (glucose) after you have not eaten for a while (fasting). Mammogram. Talk with your health care provider about how often you should have regular mammograms. BRCA-related cancer screening. This may be done if you have a family history of breast, ovarian, tubal, or peritoneal cancers. Bone density scan. This is done to screen for osteoporosis. Talk with your health care provider about your test results, treatment options, and if necessary, the need for more tests. Follow these instructions at home: Eating and drinking  Eat a diet that includes fresh fruits and vegetables, whole grains, lean protein, and low-fat dairy products. Limit your intake of foods with high amounts of sugar, saturated fats, and salt. Take vitamin and mineral supplements as recommended by your health care provider. Do not drink alcohol if your health care provider tells you not to drink. If you drink alcohol: Limit how much you have to 0-1 drink a day. Know how much alcohol is in your drink. In the [place] one drink equals one 12 oz bottle of beer (355 mL), one 5 oz glass of wine (148 mL), or one 1 oz glass of hard liquor (44 mL). Lifestyle Brush your teeth every morning and night with fluoride toothpaste. Floss one time each day. Exercise for at least 30 minutes 5 or more days each week. Do not use any products that contain nicotine or tobacco. These products include cigarettes, chewing tobacco, and vaping devices, such as e-cigarettes. If you need help quitting, ask your health care  provider. Do not use drugs. If you are sexually active, practice safe sex. Use a condom or other form of protection in order to prevent STIs. Take  aspirin only as told by your health care provider. Make sure that you understand how much to take and what form to take. Work with your health care provider to find out whether it is safe and beneficial for you to take aspirin daily. Ask your health care provider if you need to take a cholesterol-lowering medicine (statin). Find healthy ways to manage stress, such as: Meditation, yoga, or listening to music. Journaling. Talking to a trusted person. Spending time with friends and family. Minimize exposure to UV radiation to reduce your risk of skin cancer. Safety Always wear your seat belt while driving or riding in a vehicle. Do not drive: If you have been drinking alcohol. Do not ride with someone who has been drinking. When you are tired or distracted. While texting. If you have been using any mind-altering substances or drugs. Wear a helmet and other protective equipment during sports activities. If you have firearms in your house, make sure you follow all gun safety procedures. What's next? Visit your health care provider once a year for an annual wellness visit. Ask your health care provider how often you should have your eyes and teeth checked. Stay up to date on all vaccines. This information is not intended to replace advice given to you by your health care provider. Make sure you discuss any questions you have with your health care provider. Document Revised: 04/21/2021 Document Reviewed: 04/21/2021 Elsevier Patient Education  [place].

## 2021-12-28 ENCOUNTER — Telehealth: Payer: Self-pay | Admitting: Primary Care

## 2021-12-28 NOTE — Telephone Encounter (Signed)
Denise Roy called in and wanted to know about getting the bone density scan sent to [address]. They have the mammogram but not the bone density

## 2021-12-28 NOTE — Telephone Encounter (Signed)
I do not handle Bone Densities  All you need to do if print out the order, have it signed/stamped and fax it.

## 2021-12-28 NOTE — Telephone Encounter (Signed)
Do you need new order to send this?

## 2021-12-29 LAB — CYTOLOGY - PAP
Comment: NEGATIVE
Diagnosis: NEGATIVE
High risk HPV: NEGATIVE

## 2021-12-29 NOTE — Telephone Encounter (Signed)
Called and spoke to Denise Roy have routed order as requested.

## 2021-12-31 NOTE — Telephone Encounter (Signed)
I have found a way to print and faxed to number that was provided by office. I tried to call but was closed for lunch will call back this afternoon to make sure received.   Will need to update patient after contacted office.

## 2021-12-31 NOTE — Telephone Encounter (Signed)
Mrs. Jacinda called in wanted to know about picking up the referral for the bone density due to as of yesterday they didn't have it. Her appointment is Monday

## 2022-01-02 NOTE — Telephone Encounter (Signed)
Called office after lunch on Friday order was received no further action needed.

## 2022-01-03 DIAGNOSIS — Z1231 Encounter for screening mammogram for malignant neoplasm of breast: Secondary | ICD-10-CM | POA: Diagnosis not present

## 2022-01-03 DIAGNOSIS — E2839 Other primary ovarian failure: Secondary | ICD-10-CM | POA: Diagnosis not present

## 2022-01-03 DIAGNOSIS — M85852 Other specified disorders of bone density and structure, left thigh: Secondary | ICD-10-CM | POA: Diagnosis not present

## 2022-01-03 LAB — HM MAMMOGRAPHY

## 2022-01-03 LAB — HM DEXA SCAN

## 2022-01-10 ENCOUNTER — Encounter: Payer: Self-pay | Admitting: Primary Care

## 2022-02-21 DIAGNOSIS — M778 Other enthesopathies, not elsewhere classified: Secondary | ICD-10-CM | POA: Diagnosis not present

## 2022-10-13 DIAGNOSIS — J029 Acute pharyngitis, unspecified: Secondary | ICD-10-CM | POA: Diagnosis not present

## 2022-10-13 DIAGNOSIS — Z03818 Encounter for observation for suspected exposure to other biological agents ruled out: Secondary | ICD-10-CM | POA: Diagnosis not present

## 2022-10-13 DIAGNOSIS — U071 COVID-19: Secondary | ICD-10-CM | POA: Diagnosis not present

## 2022-12-05 ENCOUNTER — Other Ambulatory Visit: Payer: Self-pay | Admitting: Primary Care

## 2022-12-05 DIAGNOSIS — E785 Hyperlipidemia, unspecified: Secondary | ICD-10-CM

## 2022-12-05 DIAGNOSIS — D509 Iron deficiency anemia, unspecified: Secondary | ICD-10-CM

## 2022-12-20 ENCOUNTER — Other Ambulatory Visit (INDEPENDENT_AMBULATORY_CARE_PROVIDER_SITE_OTHER): Payer: BLUE CROSS/BLUE SHIELD

## 2022-12-20 DIAGNOSIS — E785 Hyperlipidemia, unspecified: Secondary | ICD-10-CM | POA: Diagnosis not present

## 2022-12-20 DIAGNOSIS — D509 Iron deficiency anemia, unspecified: Secondary | ICD-10-CM | POA: Diagnosis not present

## 2022-12-20 LAB — LIPID PANEL
Cholesterol: 230 mg/dL — ABNORMAL HIGH (ref 0–200)
HDL: 69.2 mg/dL (ref >39.00)
LDL Cholesterol: 141 mg/dL — ABNORMAL HIGH (ref 0–99)
NonHDL: 160.73
Total CHOL/HDL Ratio: 3
Triglycerides: 99 mg/dL (ref 0.0–149.0)
VLDL: 19.8 mg/dL (ref 0.0–40.0)

## 2022-12-20 LAB — CBC
HCT: 37.9 % (ref 36.0–46.0)
Hemoglobin: 12.5 g/dL (ref 12.0–15.0)
MCHC: 33 g/dL (ref 30.0–36.0)
MCV: 93.4 fl (ref 78.0–100.0)
Platelets: 298 10*3/uL (ref 150.0–400.0)
RBC: 4.06 Mil/uL (ref 3.87–5.11)
RDW: 13.4 % (ref 11.5–15.5)
WBC: 4.9 10*3/uL (ref 4.0–10.5)

## 2022-12-20 LAB — COMPREHENSIVE METABOLIC PANEL
ALT: 20 U/L (ref 0–35)
AST: 20 U/L (ref 0–37)
Albumin: 4.1 g/dL (ref 3.5–5.2)
Alkaline Phosphatase: 76 U/L (ref 39–117)
BUN: 15 mg/dL (ref 6–23)
CO2: 30 mEq/L (ref 19–32)
Calcium: 9.8 mg/dL (ref 8.4–10.5)
Chloride: 104 mEq/L (ref 96–112)
Creatinine, Ser: 0.96 mg/dL (ref 0.40–1.20)
GFR: 61.51 mL/min (ref >60.00)
Glucose, Bld: 94 mg/dL (ref 70–99)
Potassium: 4.7 mEq/L (ref 3.5–5.1)
Sodium: 140 mEq/L (ref 135–145)
Total Bilirubin: 0.6 mg/dL (ref 0.2–1.2)
Total Protein: 6.3 g/dL (ref 6.0–8.3)

## 2022-12-27 ENCOUNTER — Ambulatory Visit (INDEPENDENT_AMBULATORY_CARE_PROVIDER_SITE_OTHER): Payer: BLUE CROSS/BLUE SHIELD | Admitting: Primary Care

## 2022-12-27 ENCOUNTER — Encounter: Payer: Self-pay | Admitting: Primary Care

## 2022-12-27 VITALS — BP 136/84 | HR 63 | Temp 97.9°F | Ht 61.0 in | Wt 109.0 lb

## 2022-12-27 DIAGNOSIS — N898 Other specified noninflammatory disorders of vagina: Secondary | ICD-10-CM

## 2022-12-27 DIAGNOSIS — Z1231 Encounter for screening mammogram for malignant neoplasm of breast: Secondary | ICD-10-CM | POA: Diagnosis not present

## 2022-12-27 DIAGNOSIS — Z23 Encounter for immunization: Secondary | ICD-10-CM

## 2022-12-27 DIAGNOSIS — Z0000 Encounter for general adult medical examination without abnormal findings: Secondary | ICD-10-CM

## 2022-12-27 DIAGNOSIS — E785 Hyperlipidemia, unspecified: Secondary | ICD-10-CM | POA: Diagnosis not present

## 2022-12-27 NOTE — Addendum Note (Signed)
Addended by: Pat Kocher on: 12/27/2022 09:35 AM   Modules accepted: Orders

## 2022-12-27 NOTE — Progress Notes (Signed)
Subjective:    Patient ID: GRACIANA NOTHDURFT, female    DOB: Apr 19, 1956, 67 y.o.   MRN: [id number]  HPI  Denise Roy is a very pleasant 67 y.o. female who presents today for complete physical and follow up of chronic conditions.  Immunizations: -Tetanus: Completed in 2019 -Influenza: Declines this season  -Shingles: Completed Zostavax -Pneumonia: Declined   Diet: Fair diet.  Exercise: Regular exercise.  Eye exam: Completes annually  Dental exam: Completes semi-annually   Pap Smear: Completed in 2023 Mammogram: Completed in 2023  Colonoscopy: Completed in 2019, due 2029 Dexa: Completed in 2023   BP Readings from Last 3 Encounters:  12/27/22 136/84  12/24/21 102/78  08/03/21 (!) 131/56        Review of Systems  Constitutional:  Negative for unexpected weight change.  HENT:  Negative for rhinorrhea.   Respiratory:  Negative for cough and shortness of breath.   Cardiovascular:  Negative for chest pain.  Gastrointestinal:  Negative for constipation and diarrhea.  Genitourinary:  Negative for difficulty urinating.  Musculoskeletal:  Negative for arthralgias and myalgias.  Skin:  Negative for rash.  Allergic/Immunologic: Negative for environmental allergies.  Neurological:  Negative for dizziness and headaches.  Psychiatric/Behavioral:  The patient is not nervous/anxious.          Past Medical History:  Diagnosis Date   Iron deficiency anemia     Social History   Socioeconomic History   Marital status: Married    Spouse name: Not on file   Number of children: Not on file   Years of education: Not on file   Highest education level: Not on file  Occupational History   Not on file  Tobacco Use   Smoking status: Never   Smokeless tobacco: Never  Substance and Sexual Activity   Alcohol use: No   Drug use: No   Sexual activity: Never    Birth control/protection: Post-menopausal  Other Topics Concern   Not on file  Social History Narrative   Not on  file   Social Determinants of Health   Financial Resource Strain: Not on file  Food Insecurity: Not on file  Transportation Needs: Not on file  Physical Activity: Not on file  Stress: Not on file  Social Connections: Not on file  Intimate Partner Violence: Not on file    Past Surgical History:  Procedure Laterality Date   TENOSYNOVECTOMY Right 08/03/2021   Procedure: Tenosynovectomy right hand/wrist per signed consent;  Surgeon: Earnestine Leys, MD;  Location: [facility] ORS;  Service: Orthopedics;  Laterality: Right;   TRIGGER FINGER RELEASE Right     Family History  Problem Relation Age of Onset   Hypertension Mother    Cancer Father     Allergies  Allergen Reactions   Erythromycin     Stomach upset    Current Outpatient Medications on File Prior to Visit  Medication Sig Dispense Refill   BIOTIN PO Take 1 tablet by mouth daily.     clobetasol cream (TEMOVATE) [id number] % Apply 1 application topically 2 (two) times daily. 30 g 0   Prenatal Vit-Fe Fumarate-FA (PRENATAL MULTIVITAMIN) TABS tablet Take 1 tablet by mouth daily at 12 noon.     No current facility-administered medications on file prior to visit.    BP 136/84   Pulse 63   Temp 97.9 F (36.6 C) (Temporal)   Ht 5' 1"$  (1.549 m)   Wt 109 lb (49.4 kg)   SpO2 98%   BMI 20.60 kg/m  Objective:   Physical Exam HENT:     Right Ear: Tympanic membrane and ear canal normal.     Left Ear: Tympanic membrane and ear canal normal.     Nose: Nose normal.  Eyes:     Conjunctiva/sclera: Conjunctivae normal.     Pupils: Pupils are equal, round, and reactive to light.  Neck:     Thyroid: No thyromegaly.  Cardiovascular:     Rate and Rhythm: Normal rate and regular rhythm.     Heart sounds: No murmur heard. Pulmonary:     Effort: Pulmonary effort is normal.     Breath sounds: Normal breath sounds. No rales.  Abdominal:     General: Bowel sounds are normal.     Palpations: Abdomen is soft.     Tenderness: There is no  abdominal tenderness.  Musculoskeletal:        General: Normal range of motion.     Cervical back: Neck supple.  Lymphadenopathy:     Cervical: No cervical adenopathy.  Skin:    General: Skin is warm and dry.     Findings: No rash.  Neurological:     Mental Status: She is alert and oriented to person, place, and time.     Cranial Nerves: No cranial nerve deficit.     Deep Tendon Reflexes: Reflexes are normal and symmetric.  Psychiatric:        Mood and Affect: Mood normal.           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  Commended her on regular exercise, encouraged to continue.  Repeat lipid panel in 3-6 months. Will add Lipoprotein A.  Orders: -     Lipid panel; Future -     Lipoprotein A (LPA); Future  Vaginal itching Assessment & Plan: Controlled.  Continue Clobetasol 0.05% cream PRN   Preventative health care Assessment & Plan: Prevnar 20 due and provided today. Discussed Shingrix to obtain at the pharmacy. Declines influenza. Mammogram due, orders placed. Colonoscopy UTD, due 2029.  Discussed the importance of a healthy diet and regular exercise in order for weight loss, and to reduce the risk of further co-morbidity.  Exam stable. Labs pending.  Follow up in 1 year for repeat physical.          Pleas Koch, NP

## 2022-12-27 NOTE — Assessment & Plan Note (Signed)
Controlled.  Continue Clobetasol 0.05% cream PRN

## 2022-12-27 NOTE — Patient Instructions (Signed)
I recommend you start taking Calcium 1200 mg with Vitamin D 800 units everyday for bone strength.   Weight bearing exercise is also very important in bone strength, make sure to exercise regularly.   Schedule a lab appointment in 3-6 months to repeat cholesterol.   It was a pleasure to see you today!

## 2022-12-27 NOTE — Assessment & Plan Note (Signed)
Likely hereditary.  Commended her on regular exercise, encouraged to continue.  Repeat lipid panel in 3-6 months. Will add Lipoprotein A.

## 2022-12-27 NOTE — Assessment & Plan Note (Signed)
Prevnar 20 due and provided today. Discussed Shingrix to obtain at the pharmacy. Declines influenza. Mammogram due, orders placed. Colonoscopy UTD, due 2029.  Discussed the importance of a healthy diet and regular exercise in order for weight loss, and to reduce the risk of further co-morbidity.  Exam stable. Labs pending.  Follow up in 1 year for repeat physical.

## 2022-12-28 ENCOUNTER — Encounter: Payer: BLUE CROSS/BLUE SHIELD | Admitting: Primary Care

## 2022-12-30 ENCOUNTER — Encounter: Payer: BLUE CROSS/BLUE SHIELD | Admitting: Primary Care

## 2023-01-02 ENCOUNTER — Telehealth: Payer: Self-pay | Admitting: Primary Care

## 2023-01-02 NOTE — Telephone Encounter (Signed)
Pt called in stated she got a Pneumonia vaccine at her last visit over a week ago and the area is red  and itchy . Pt wants to know should she be concern or do she need a appointment . Please advise [phone]

## 2023-01-03 NOTE — Telephone Encounter (Signed)
Called and notified patient, she states the spot is improving some. The spot is not as hot and red and the itching has seemed to ease up some. She will watch and wait and notify us if the symptoms gets worse to set up an appointment. Advised she can try antihistamine for symptoms.

## 2023-01-03 NOTE — Telephone Encounter (Signed)
Please tell her that we are sorry to hear this! If the redness and itching is improving then okay to watch and wait.  If the symptoms are increasing then recommend office visit.  She can try taking an antihistamine such as Zyrtec, Claritin, Allegra for symptoms.

## 2023-01-04 DIAGNOSIS — Z1231 Encounter for screening mammogram for malignant neoplasm of breast: Secondary | ICD-10-CM | POA: Diagnosis not present

## 2023-01-04 LAB — HM MAMMOGRAPHY

## 2023-02-25 DIAGNOSIS — R059 Cough, unspecified: Secondary | ICD-10-CM | POA: Diagnosis not present

## 2023-02-25 DIAGNOSIS — J40 Bronchitis, not specified as acute or chronic: Secondary | ICD-10-CM | POA: Diagnosis not present

## 2023-02-25 DIAGNOSIS — Z20822 Contact with and (suspected) exposure to covid-19: Secondary | ICD-10-CM | POA: Diagnosis not present

## 2023-05-29 ENCOUNTER — Other Ambulatory Visit: Payer: BLUE CROSS/BLUE SHIELD

## 2023-07-04 DIAGNOSIS — M7742 Metatarsalgia, left foot: Secondary | ICD-10-CM | POA: Diagnosis not present

## 2023-08-31 DIAGNOSIS — L309 Dermatitis, unspecified: Secondary | ICD-10-CM | POA: Diagnosis not present

## 2023-08-31 DIAGNOSIS — L820 Inflamed seborrheic keratosis: Secondary | ICD-10-CM | POA: Diagnosis not present

## 2023-08-31 DIAGNOSIS — L814 Other melanin hyperpigmentation: Secondary | ICD-10-CM | POA: Diagnosis not present

## 2023-08-31 DIAGNOSIS — D225 Melanocytic nevi of trunk: Secondary | ICD-10-CM | POA: Diagnosis not present

## 2023-08-31 DIAGNOSIS — D485 Neoplasm of uncertain behavior of skin: Secondary | ICD-10-CM | POA: Diagnosis not present

## 2023-08-31 DIAGNOSIS — L538 Other specified erythematous conditions: Secondary | ICD-10-CM | POA: Diagnosis not present

## 2023-08-31 DIAGNOSIS — L821 Other seborrheic keratosis: Secondary | ICD-10-CM | POA: Diagnosis not present

## 2023-09-18 DIAGNOSIS — C44311 Basal cell carcinoma of skin of nose: Secondary | ICD-10-CM | POA: Diagnosis not present

## 2023-10-19 DIAGNOSIS — H2513 Age-related nuclear cataract, bilateral: Secondary | ICD-10-CM | POA: Diagnosis not present

## 2023-10-19 DIAGNOSIS — H04123 Dry eye syndrome of bilateral lacrimal glands: Secondary | ICD-10-CM | POA: Diagnosis not present

## 2023-10-19 DIAGNOSIS — H43813 Vitreous degeneration, bilateral: Secondary | ICD-10-CM | POA: Diagnosis not present

## 2023-10-19 DIAGNOSIS — Z0100 Encounter for examination of eyes and vision without abnormal findings: Secondary | ICD-10-CM | POA: Diagnosis not present

## 2023-12-25 ENCOUNTER — Other Ambulatory Visit (INDEPENDENT_AMBULATORY_CARE_PROVIDER_SITE_OTHER): Payer: Commercial Managed Care - PPO

## 2023-12-25 DIAGNOSIS — E785 Hyperlipidemia, unspecified: Secondary | ICD-10-CM | POA: Diagnosis not present

## 2023-12-25 LAB — LIPID PANEL
Cholesterol: 195 mg/dL (ref 0–200)
HDL: 61.6 mg/dL (ref >39.00)
LDL Cholesterol: 113 mg/dL — ABNORMAL HIGH (ref 0–99)
NonHDL: 133.67
Total CHOL/HDL Ratio: 3
Triglycerides: 103 mg/dL (ref 0.0–149.0)
VLDL: 20.6 mg/dL (ref 0.0–40.0)

## 2023-12-28 LAB — LIPOPROTEIN A (LPA): Lipoprotein (a): <10 nmol/L (ref <75)

## 2023-12-29 ENCOUNTER — Ambulatory Visit (INDEPENDENT_AMBULATORY_CARE_PROVIDER_SITE_OTHER): Payer: Commercial Managed Care - PPO | Admitting: Primary Care

## 2023-12-29 ENCOUNTER — Encounter: Payer: Self-pay | Admitting: Primary Care

## 2023-12-29 VITALS — BP 126/78 | HR 59 | Temp 97.3°F | Ht 61.0 in | Wt 108.0 lb

## 2023-12-29 DIAGNOSIS — N898 Other specified noninflammatory disorders of vagina: Secondary | ICD-10-CM | POA: Diagnosis not present

## 2023-12-29 DIAGNOSIS — E2839 Other primary ovarian failure: Secondary | ICD-10-CM

## 2023-12-29 DIAGNOSIS — Z0000 Encounter for general adult medical examination without abnormal findings: Secondary | ICD-10-CM | POA: Diagnosis not present

## 2023-12-29 DIAGNOSIS — E785 Hyperlipidemia, unspecified: Secondary | ICD-10-CM | POA: Diagnosis not present

## 2023-12-29 DIAGNOSIS — D509 Iron deficiency anemia, unspecified: Secondary | ICD-10-CM | POA: Diagnosis not present

## 2023-12-29 DIAGNOSIS — Z1231 Encounter for screening mammogram for malignant neoplasm of breast: Secondary | ICD-10-CM

## 2023-12-29 LAB — COMPREHENSIVE METABOLIC PANEL
ALT: 20 U/L (ref 0–35)
AST: 21 U/L (ref 0–37)
Albumin: 4.3 g/dL (ref 3.5–5.2)
Alkaline Phosphatase: 74 U/L (ref 39–117)
BUN: 12 mg/dL (ref 6–23)
CO2: 31 meq/L (ref 19–32)
Calcium: 9.4 mg/dL (ref 8.4–10.5)
Chloride: 104 meq/L (ref 96–112)
Creatinine, Ser: 0.96 mg/dL (ref 0.40–1.20)
GFR: 61.07 mL/min (ref >60.00)
Glucose, Bld: 84 mg/dL (ref 70–99)
Potassium: 4.2 meq/L (ref 3.5–5.1)
Sodium: 141 meq/L (ref 135–145)
Total Bilirubin: 0.6 mg/dL (ref 0.2–1.2)
Total Protein: 6.7 g/dL (ref 6.0–8.3)

## 2023-12-29 LAB — CBC
HCT: 37.3 % (ref 36.0–46.0)
Hemoglobin: 12.3 g/dL (ref 12.0–15.0)
MCHC: 33.1 g/dL (ref 30.0–36.0)
MCV: 93 fL (ref 78.0–100.0)
Platelets: 248 10*3/uL (ref 150.0–400.0)
RBC: 4.01 Mil/uL (ref 3.87–5.11)
RDW: 13.1 % (ref 11.5–15.5)
WBC: 4.5 10*3/uL (ref 4.0–10.5)

## 2023-12-29 LAB — IBC + FERRITIN
Ferritin: 24.2 ng/mL (ref 10.0–291.0)
Iron: 71 ug/dL (ref 42–145)
Saturation Ratios: 23.5 % (ref 20.0–50.0)
TIBC: 302.4 ug/dL (ref 250.0–450.0)
Transferrin: 216 mg/dL (ref 212.0–360.0)

## 2023-12-29 MED ORDER — CLOBETASOL PROPIONATE 0.05 % EX CREA: 1.0000 | TOPICAL_CREAM | Freq: Two times a day (BID) | CUTANEOUS | 0 refills | Status: AC

## 2023-12-29 NOTE — Patient Instructions (Signed)
 Stop by the lab prior to leaving today. I will notify you of your results once received.   It was a pleasure to see you today!

## 2023-12-29 NOTE — Assessment & Plan Note (Signed)
Repeat iron studies pending.

## 2023-12-29 NOTE — Assessment & Plan Note (Signed)
Declines flu vaccine. Discussed Shingrix. Mammogram and bone density scan due, orders placed. Colonoscopy UTD, due 2029? She will double check with [facility]  Discussed the importance of a healthy diet and regular exercise in order for weight loss, and to reduce the risk of further co-morbidity.  Exam stable. Labs pending.  Follow up in 1 year for repeat physical.

## 2023-12-29 NOTE — Progress Notes (Signed)
Subjective:    Patient ID: Denise Roy, female    DOB: 01/10/1956, 68 y.o.   MRN: [id number]  HPI  Denise Roy is a very pleasant 68 y.o. female who presents today for complete physical and follow up of chronic conditions.  Immunizations: -Tetanus: Completed in 2019 -Influenza: Declines influenza vaccine.  -Shingles: Completed Zostavax -Pneumonia: Completed Prevnar 20 in 2024  Diet: Fair diet.  Exercise: Regular exercise during the week.   Eye exam: Completes annually  Dental exam: Completes semi-annually    Mammogram: Completed in February 2024 Bone Density Scan: Completed in February 2023  Colonoscopy: Completed in 2019, due 2029   BP Readings from Last 3 Encounters:  12/29/23 126/78  12/27/22 136/84  12/24/21 102/78      Review of Systems  Constitutional:  Negative for unexpected weight change.  HENT:  Negative for rhinorrhea.   Respiratory:  Negative for cough and shortness of breath.   Cardiovascular:  Negative for chest pain.  Gastrointestinal:  Negative for constipation and diarrhea.  Genitourinary:  Negative for difficulty urinating.  Musculoskeletal:  Negative for arthralgias and myalgias.  Skin:  Negative for rash.  Allergic/Immunologic: Negative for environmental allergies.  Neurological:  Negative for dizziness, numbness and headaches.  Psychiatric/Behavioral:  The patient is not nervous/anxious.          Past Medical History:  Diagnosis Date   Iron deficiency anemia     Social History   Socioeconomic History   Marital status: Married    Spouse name: Not on file   Number of children: Not on file   Years of education: Not on file   Highest education level: Not on file  Occupational History   Not on file  Tobacco Use   Smoking status: Never   Smokeless tobacco: Never  Substance and Sexual Activity   Alcohol use: No   Drug use: No   Sexual activity: Never    Birth control/protection: Post-menopausal  Other Topics Concern    Not on file  Social History Narrative   Not on file   Social Drivers of Health   Financial Resource Strain: Not on file  Food Insecurity: Not on file  Transportation Needs: Not on file  Physical Activity: Not on file  Stress: Not on file  Social Connections: Not on file  Intimate Partner Violence: Not on file    Past Surgical History:  Procedure Laterality Date   TENOSYNOVECTOMY Right 08/03/2021   Procedure: Tenosynovectomy right hand/wrist per signed consent;  Surgeon: Deeann Saint, MD;  Location: [facility];  Service: Orthopedics;  Laterality: Right;   TRIGGER FINGER RELEASE Right     Family History  Problem Relation Age of Onset   Hypertension Mother    Cancer Father     Allergies  Allergen Reactions   Erythromycin     Stomach upset    Current Outpatient Medications on File Prior to Visit  Medication Sig Dispense Refill   BIOTIN PO Take 1 tablet by mouth daily.     CALCIUM PO Take by mouth.     Prenatal Vit-Fe Fumarate-FA (PRENATAL MULTIVITAMIN) TABS tablet Take 1 tablet by mouth daily at 12 noon.     No current facility-administered medications on file prior to visit.    BP 126/78   Pulse (!) 59   Temp (!) 97.3 F (36.3 C) (Temporal)   Ht 5\' 1"  (1.549 m)   Wt 108 lb (49 kg)   SpO2 99%   BMI 20.41 kg/m  Objective:  Physical Exam HENT:     Right Ear: Tympanic membrane and ear canal normal.     Left Ear: Tympanic membrane and ear canal normal.  Eyes:     Pupils: Pupils are equal, round, and reactive to light.  Cardiovascular:     Rate and Rhythm: Normal rate and regular rhythm.  Pulmonary:     Effort: Pulmonary effort is normal.     Breath sounds: Normal breath sounds.  Abdominal:     General: Bowel sounds are normal.     Palpations: Abdomen is soft.     Tenderness: There is no abdominal tenderness.  Musculoskeletal:        General: Normal range of motion.     Cervical back: Neck supple.  Skin:    General: Skin is warm and dry.  Neurological:      Mental Status: She is alert and oriented to person, place, and time.     Cranial Nerves: No cranial nerve deficit.     Deep Tendon Reflexes:     Reflex Scores:      Patellar reflexes are 2+ on the right side and 2+ on the left side. Psychiatric:        Mood and Affect: Mood normal.           Assessment & Plan:  Preventative health care Assessment & Plan: Declines flu vaccine. Discussed Shingrix. Mammogram and bone density scan due, orders placed. Colonoscopy UTD, due 2029? She will double check with [facility]  Discussed the importance of a healthy diet and regular exercise in order for weight loss, and to reduce the risk of further co-morbidity.  Exam stable. Labs pending.  Follow up in 1 year for repeat physical.    Vaginal itching Assessment & Plan: Controlled.  Continue clobetasol 0.05% cream as needed for which she uses sparingly.  Orders: -     Clobetasol Propionate; Apply 1 Application topically 2 (two) times daily.  Dispense: 30 g; Refill: 0  Hyperlipidemia, unspecified hyperlipidemia type Assessment & Plan: Reviewed recent lipid panel and lipoprotein a. Commended her on regular exercise and improve diet.  Offered coronary calcium CT scanning for which she kindly declines.  Orders: -     Comprehensive metabolic panel  Iron deficiency anemia, unspecified iron deficiency anemia type Assessment & Plan: Repeat iron studies pending.  Orders: -     CBC -     IBC + Ferritin  Screening mammogram for breast cancer -     3D Screening Mammogram, Left and Right; Future  Estrogen deficiency -     DG Bone Density; Future        Doreene Nest, NP

## 2023-12-29 NOTE — Assessment & Plan Note (Signed)
Controlled.  Continue clobetasol 0.05% cream as needed for which she uses sparingly.

## 2023-12-29 NOTE — Assessment & Plan Note (Signed)
Reviewed recent lipid panel and lipoprotein a. Commended her on regular exercise and improve diet.  Offered coronary calcium CT scanning for which she kindly declines.

## 2024-01-03 ENCOUNTER — Telehealth: Payer: Self-pay

## 2024-01-03 NOTE — Telephone Encounter (Signed)
 Called and spoke with patient she needs orders faxed to:  [facility] [phone] Orders routed as requested.

## 2024-01-03 NOTE — Telephone Encounter (Signed)
 Copied from CRM [id number]. Topic: Clinical - Request for Lab/Test Order >> Jan 03, 2024  1:35 PM Denise Roy wrote: Reason for CRM: Patient calling in stating that the clinic she is supposed to get her mammogram and bone density checked does not have the orders placed. I confirmed with the patient that the orders were placed on 12/29/2023.

## 2024-01-08 LAB — HM DEXA SCAN

## 2024-01-08 LAB — HM MAMMOGRAPHY

## 2024-01-09 ENCOUNTER — Encounter: Payer: Self-pay | Admitting: Primary Care

## 2024-12-30 ENCOUNTER — Other Ambulatory Visit: Payer: Commercial Managed Care - PPO

## 2024-12-31 ENCOUNTER — Encounter: Payer: Commercial Managed Care - PPO | Admitting: Primary Care
